# Patient Record
Sex: Male | Born: 1940
Health system: Southern US, Community
[De-identification: ages and names within clinical notes are randomized; demographics above are authoritative.]

## PROBLEM LIST (undated history)

## (undated) DIAGNOSIS — N529 Male erectile dysfunction, unspecified: Secondary | ICD-10-CM

## (undated) DIAGNOSIS — E1165 Type 2 diabetes mellitus with hyperglycemia: Secondary | ICD-10-CM

## (undated) DIAGNOSIS — K649 Unspecified hemorrhoids: Secondary | ICD-10-CM

## (undated) DIAGNOSIS — K5900 Constipation, unspecified: Secondary | ICD-10-CM

## (undated) DIAGNOSIS — J45909 Unspecified asthma, uncomplicated: Secondary | ICD-10-CM

## (undated) DIAGNOSIS — I2510 Atherosclerotic heart disease of native coronary artery without angina pectoris: Secondary | ICD-10-CM

## (undated) DIAGNOSIS — N400 Enlarged prostate without lower urinary tract symptoms: Secondary | ICD-10-CM

## (undated) DIAGNOSIS — K589 Irritable bowel syndrome without diarrhea: Secondary | ICD-10-CM

## (undated) DIAGNOSIS — E785 Hyperlipidemia, unspecified: Secondary | ICD-10-CM

## (undated) DIAGNOSIS — I10 Essential (primary) hypertension: Secondary | ICD-10-CM

## (undated) DIAGNOSIS — J309 Allergic rhinitis, unspecified: Secondary | ICD-10-CM

## (undated) HISTORY — DX: Unspecified asthma, uncomplicated: J45.909

## (undated) HISTORY — PX: CORONARY ANGIOPLASTY WITH STENT PLACEMENT: SHX49

## (undated) HISTORY — DX: Hyperlipidemia, unspecified: E78.5

## (undated) HISTORY — DX: Male erectile dysfunction, unspecified: N52.9

## (undated) HISTORY — DX: Benign prostatic hyperplasia without lower urinary tract symptoms: N40.0

## (undated) HISTORY — DX: Atherosclerotic heart disease of native coronary artery without angina pectoris: I25.10

## (undated) HISTORY — DX: Allergic rhinitis, unspecified: J30.9

## (undated) HISTORY — DX: Constipation, unspecified: K59.00

## (undated) HISTORY — PX: TONSILLECTOMY: SUR1361

## (undated) HISTORY — DX: Unspecified hemorrhoids: K64.9

## (undated) HISTORY — DX: Type 2 diabetes mellitus with hyperglycemia: E11.65

## (undated) HISTORY — DX: Irritable bowel syndrome, unspecified: K58.9

## (undated) HISTORY — DX: Essential (primary) hypertension: I10

---

## 2006-10-29 HISTORY — PX: CORONARY ARTERY BYPASS GRAFT: SHX141

## 2007-03-19 ENCOUNTER — Ambulatory Visit (HOSPITAL_BASED_OUTPATIENT_CLINIC_OR_DEPARTMENT_OTHER): Admission: RE | Admit: 2007-03-19 | Discharge: 2007-03-19 | Payer: Self-pay | Admitting: Pediatrics

## 2007-03-24 ENCOUNTER — Ambulatory Visit: Payer: Self-pay | Admitting: Internal Medicine

## 2007-03-31 ENCOUNTER — Inpatient Hospital Stay (HOSPITAL_BASED_OUTPATIENT_CLINIC_OR_DEPARTMENT_OTHER): Admission: RE | Admit: 2007-03-31 | Discharge: 2007-03-31 | Payer: Self-pay | Admitting: Interventional Cardiology

## 2007-03-31 ENCOUNTER — Ambulatory Visit: Payer: Self-pay | Admitting: Vascular Surgery

## 2007-03-31 ENCOUNTER — Ambulatory Visit (HOSPITAL_COMMUNITY): Admission: RE | Admit: 2007-03-31 | Discharge: 2007-03-31 | Payer: Self-pay | Admitting: Interventional Cardiology

## 2007-05-29 ENCOUNTER — Ambulatory Visit: Payer: Self-pay | Admitting: Cardiothoracic Surgery

## 2007-05-30 ENCOUNTER — Ambulatory Visit: Payer: Self-pay | Admitting: Cardiothoracic Surgery

## 2007-06-06 ENCOUNTER — Inpatient Hospital Stay (HOSPITAL_COMMUNITY): Admission: RE | Admit: 2007-06-06 | Discharge: 2007-06-11 | Payer: Self-pay | Admitting: Cardiothoracic Surgery

## 2007-06-06 ENCOUNTER — Ambulatory Visit: Payer: Self-pay | Admitting: Cardiothoracic Surgery

## 2007-06-25 ENCOUNTER — Encounter: Admission: RE | Admit: 2007-06-25 | Discharge: 2007-06-25 | Payer: Self-pay | Admitting: Interventional Cardiology

## 2007-07-04 ENCOUNTER — Ambulatory Visit: Payer: Self-pay | Admitting: Cardiothoracic Surgery

## 2007-07-04 ENCOUNTER — Encounter: Admission: RE | Admit: 2007-07-04 | Discharge: 2007-07-04 | Payer: Self-pay | Admitting: Cardiothoracic Surgery

## 2007-07-10 ENCOUNTER — Encounter (HOSPITAL_COMMUNITY): Admission: RE | Admit: 2007-07-10 | Discharge: 2007-10-08 | Payer: Self-pay | Admitting: Interventional Cardiology

## 2007-10-09 ENCOUNTER — Encounter (HOSPITAL_COMMUNITY): Admission: RE | Admit: 2007-10-09 | Discharge: 2007-10-29 | Payer: Self-pay | Admitting: Interventional Cardiology

## 2008-01-03 IMAGING — CR DG CHEST 1V PORT
1 series · 1 of 1 positions shown · non-contrast
Comparison: 06/07/07.

CLINICAL DATA: Coronary artery disease.
 PORTABLE CHEST ? 1 VIEW ? 06/08/07 ? 2022 HOURS:

[view not recorded]
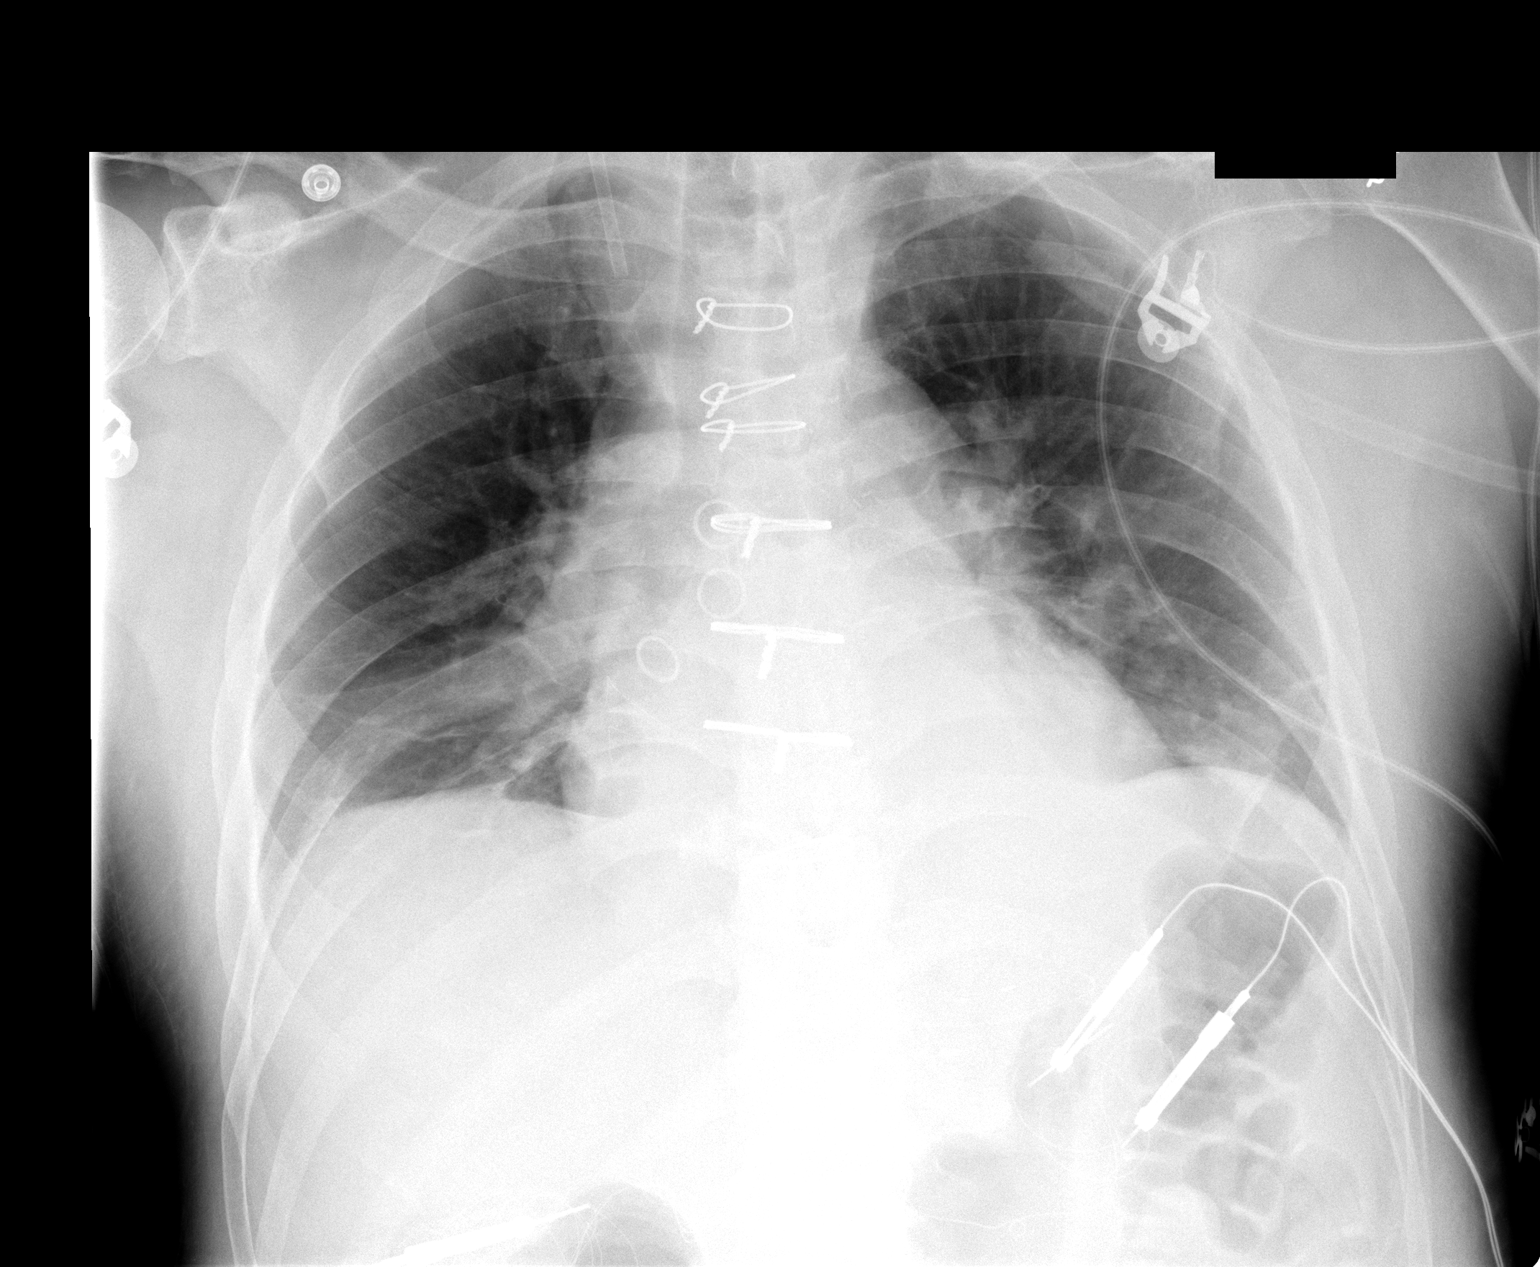

[1 of 1 positions shown; findings below may reference images not displayed]

FINDINGS: Left chest tubes have been removed.  No pneumothorax.  Swan-Ganz catheter is out, but the introducer left in place. Bibasilar atelectasis is worse.
IMPRESSION: 1. Chest tube removal without pneumothorax.  
 2. Worsening bibasilar atelectasis.

## 2008-01-29 IMAGING — CR DG CHEST 2V
2 series · 2 of 2 positions shown · non-contrast
Comparison: none

CLINICAL DATA: Post CABG, 06/06/07.
 CHEST - TWO VIEWS:

[w chest pa]
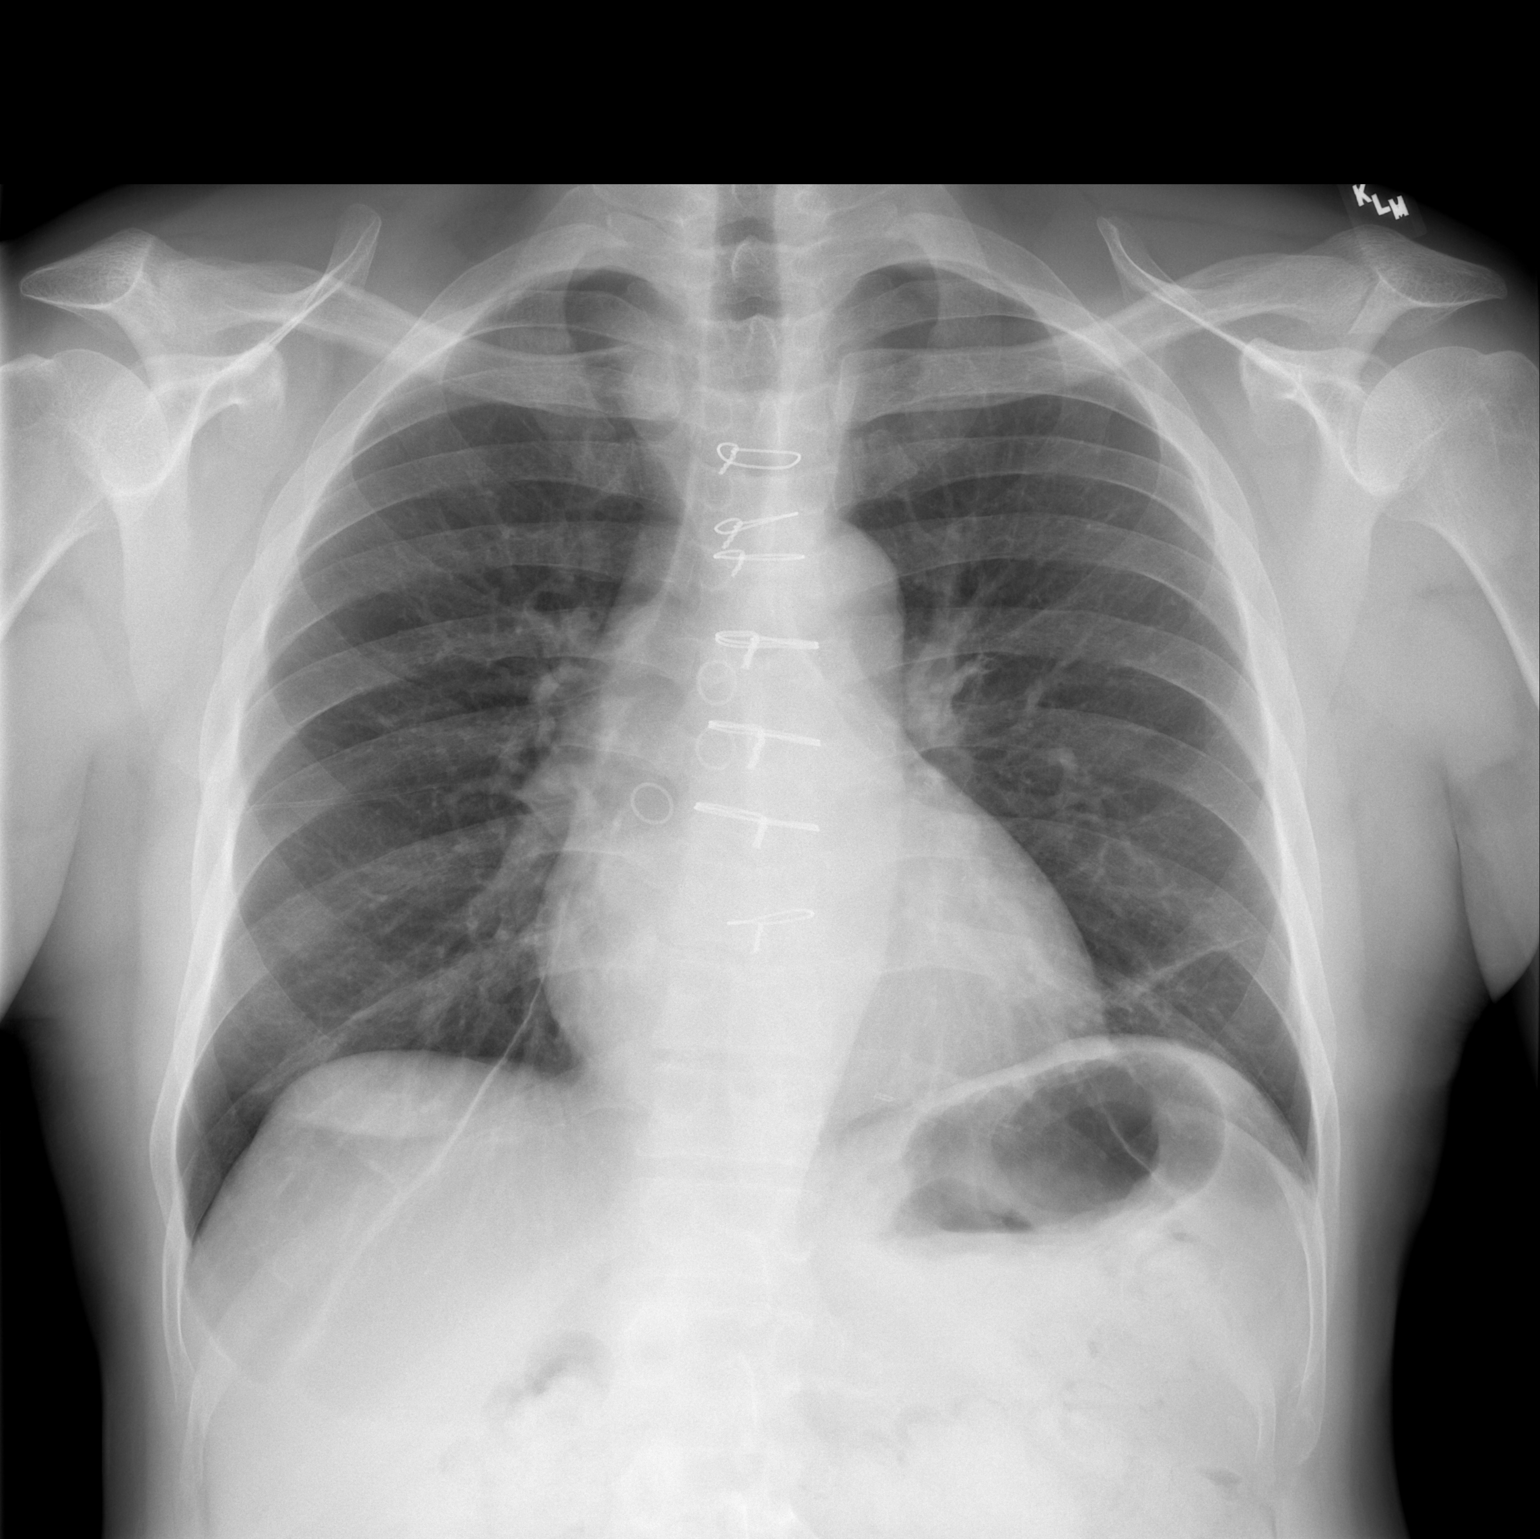

[w chest lat]
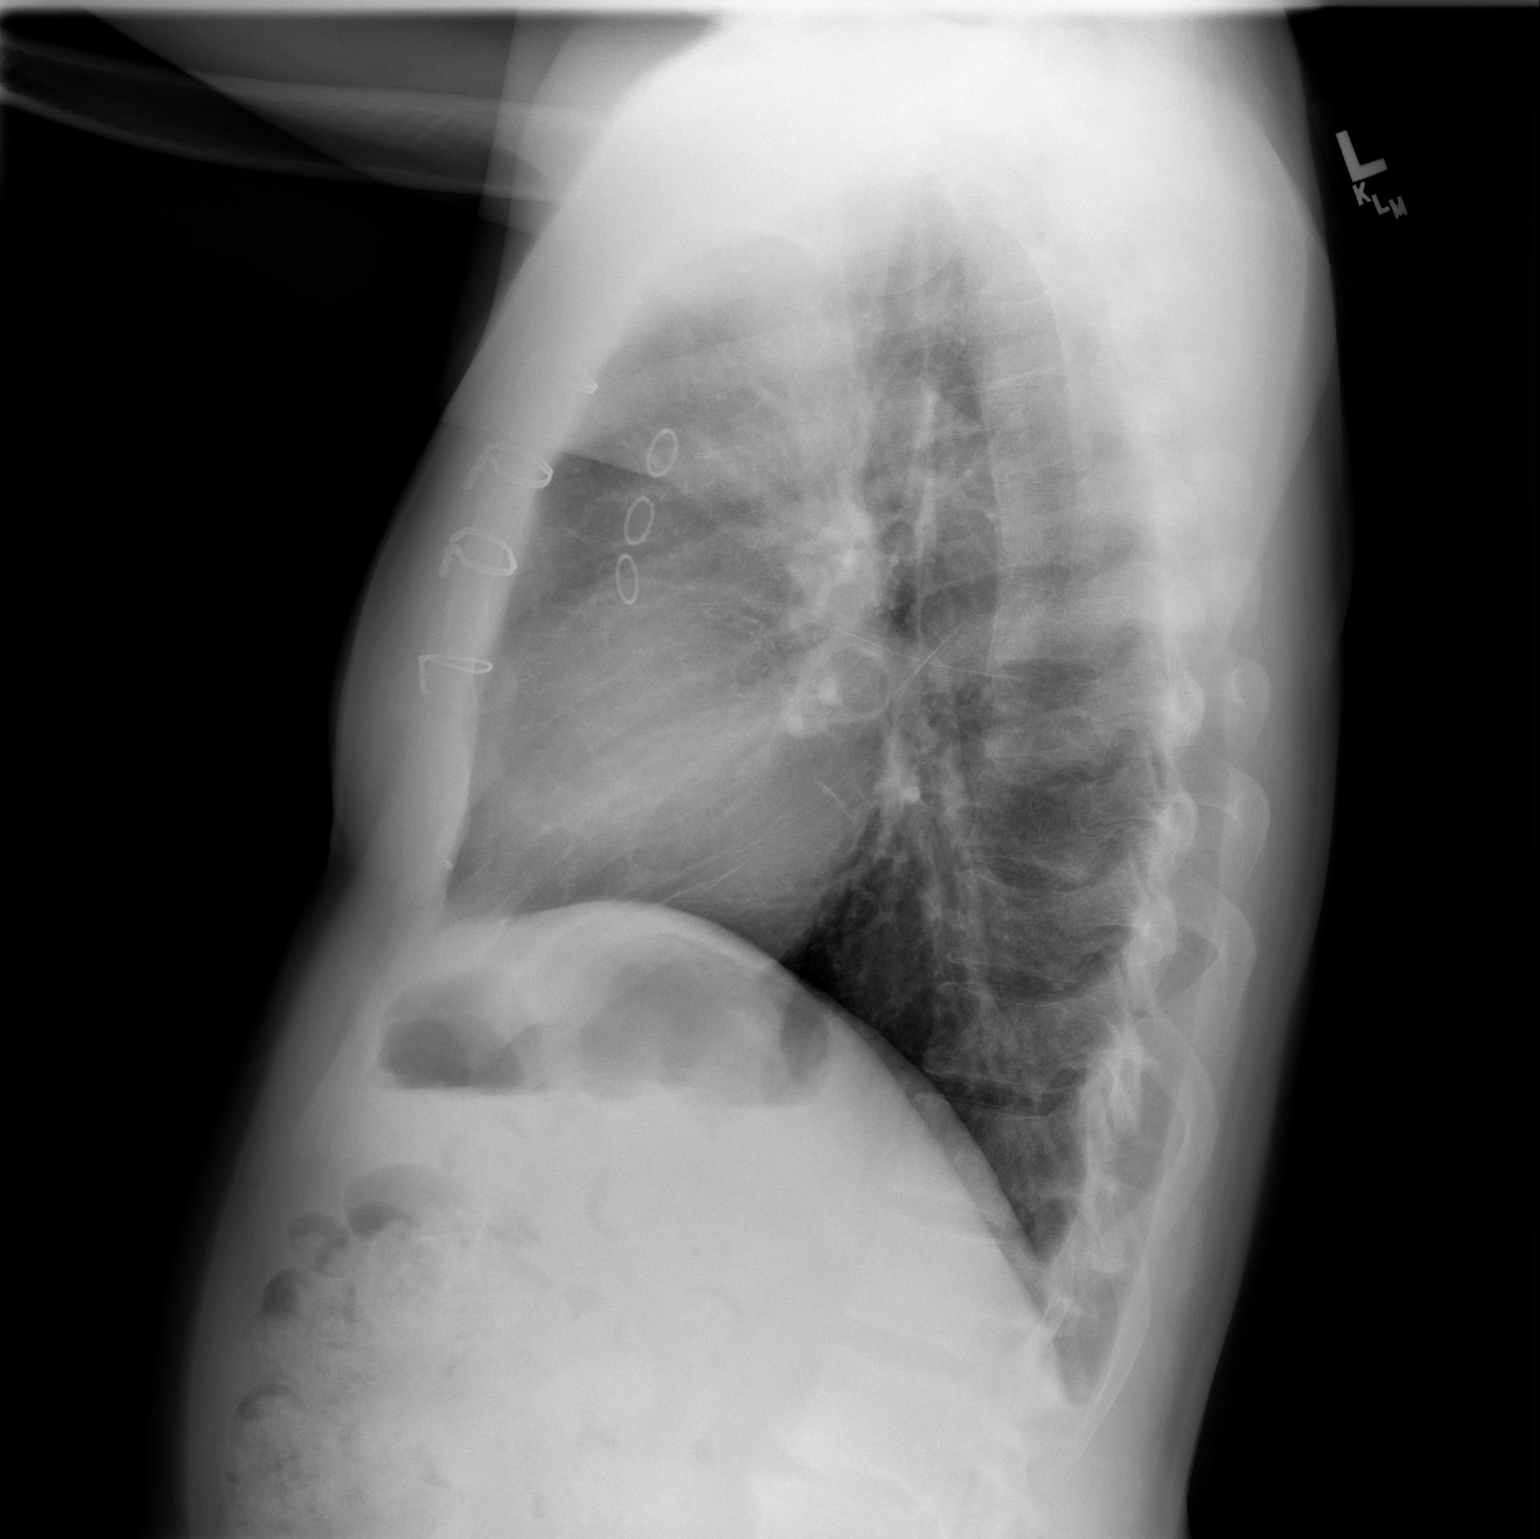

[2 of 2 positions shown; findings below may reference images not displayed]

FINDINGS: Clearing of previous very small pleural effusions noted with slight persistent linear bibasilar subsegmental atelectasis.  Lungs are otherwise clear.  Heart size is normal with post CABG changes.  Mediastinum, hila, pleura, and osseous structures appear normal.
IMPRESSION: 1.  Resolution of previous tiny bilateral pleural effusions.
 2.  Slight bibasilar linear atelectasis.
 3.  Post CABG.
 4.  Otherwise no active disease.

## 2008-03-09 ENCOUNTER — Encounter: Admission: RE | Admit: 2008-03-09 | Discharge: 2008-03-09 | Payer: Self-pay | Admitting: Interventional Radiology

## 2008-05-19 ENCOUNTER — Encounter: Admission: RE | Admit: 2008-05-19 | Discharge: 2008-05-19 | Payer: Self-pay | Admitting: Interventional Radiology

## 2008-05-26 ENCOUNTER — Encounter: Admission: RE | Admit: 2008-05-26 | Discharge: 2008-05-26 | Payer: Self-pay | Admitting: Interventional Radiology

## 2008-06-29 ENCOUNTER — Encounter: Admission: RE | Admit: 2008-06-29 | Discharge: 2008-06-29 | Payer: Self-pay | Admitting: Interventional Radiology

## 2008-11-16 ENCOUNTER — Encounter: Admission: RE | Admit: 2008-11-16 | Discharge: 2008-11-16 | Payer: Self-pay | Admitting: Interventional Radiology

## 2008-12-21 IMAGING — US US EXTREM LOW VENOUS*L*
1 series · 14 of 23 positions shown · non-contrast
Comparison: none

CLINICAL DATA: Symptomatic left leg varicose veins, now 1 week
status post transcatheter laser occlusion of the   greater
saphenous vein.

LEFT LOWER EXTREMITY VENOUS DOPPLER ULTRASOUND
TECHNIQUE: Gray-scale sonography with compression as well as color
and duplex Doppler ultrasound were performed to evaluate   the
saphenous vein and the deep venous system from the level of the
common femoral vein through the popliteal and proximal calf veins.

[Series 1: us extrem low venous*left* · 14 of 23 slices shown]
[im 1/23]
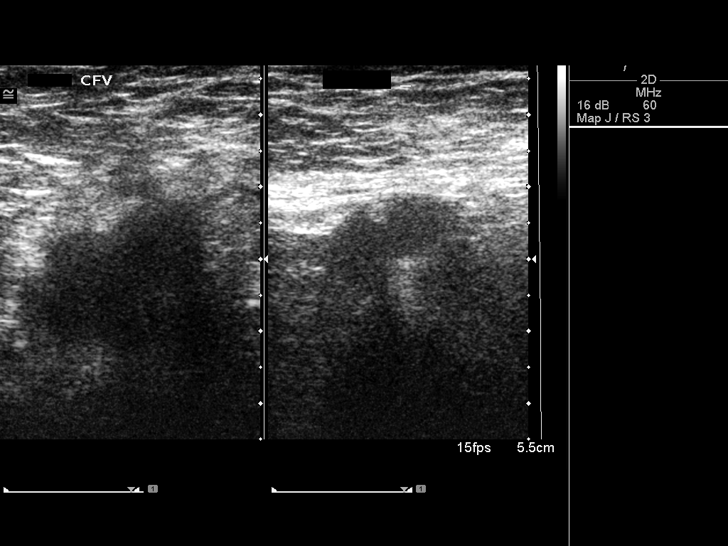
[im 3/23]
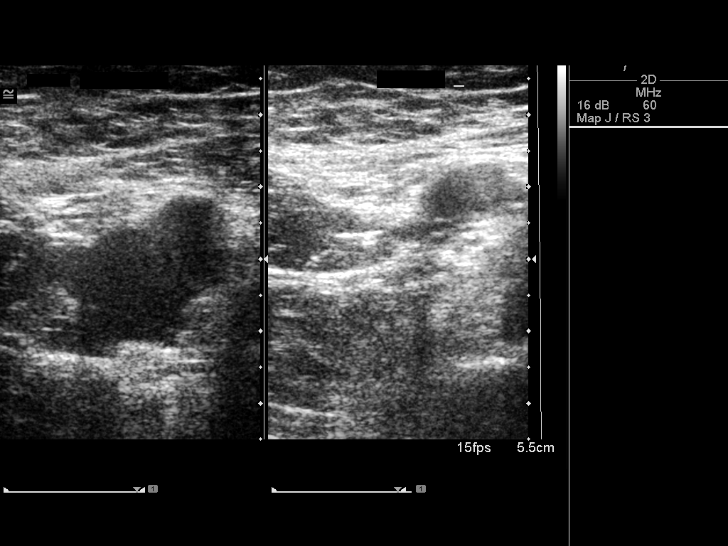
[im 5/23]
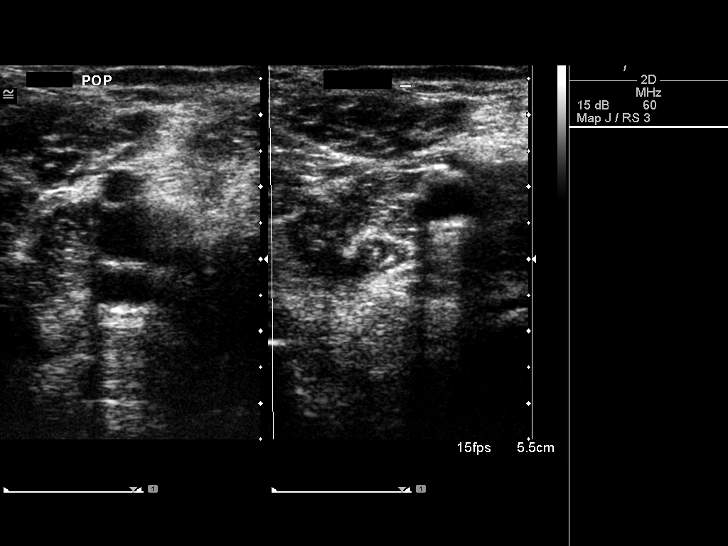
[im 6/23]
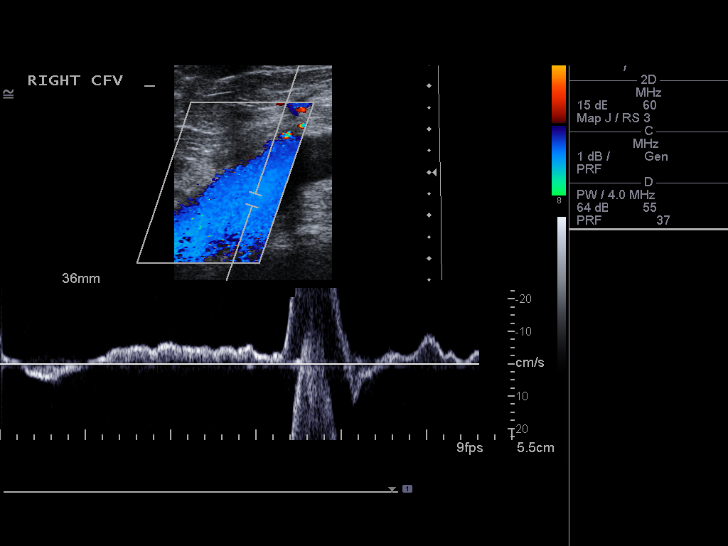
[im 8/23]
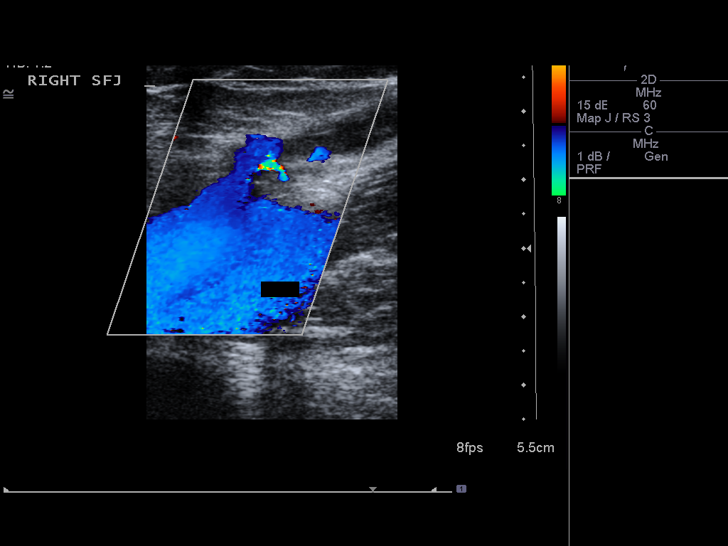
[im 10/23]
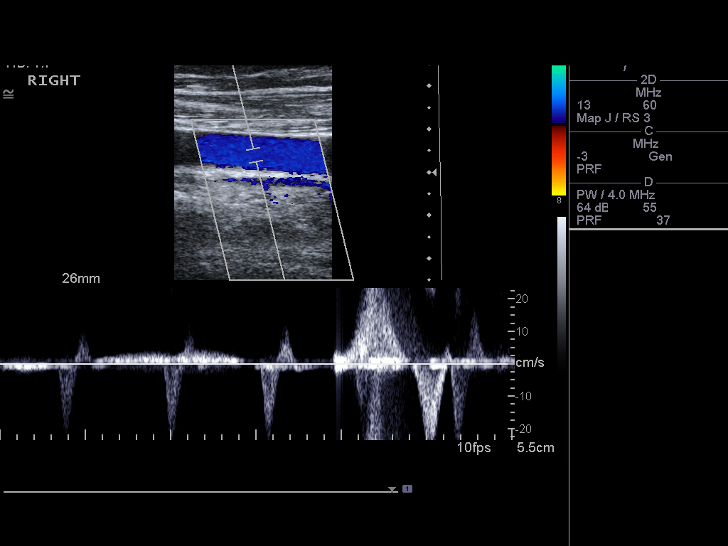
[im 11/23]
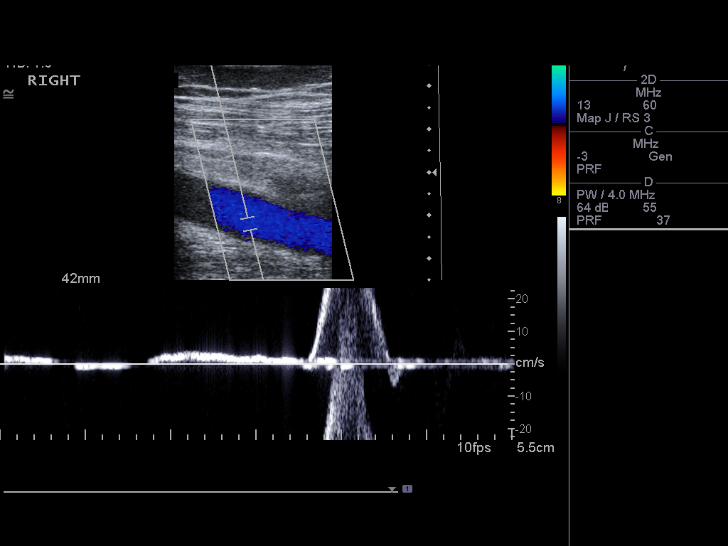
[im 13/23]
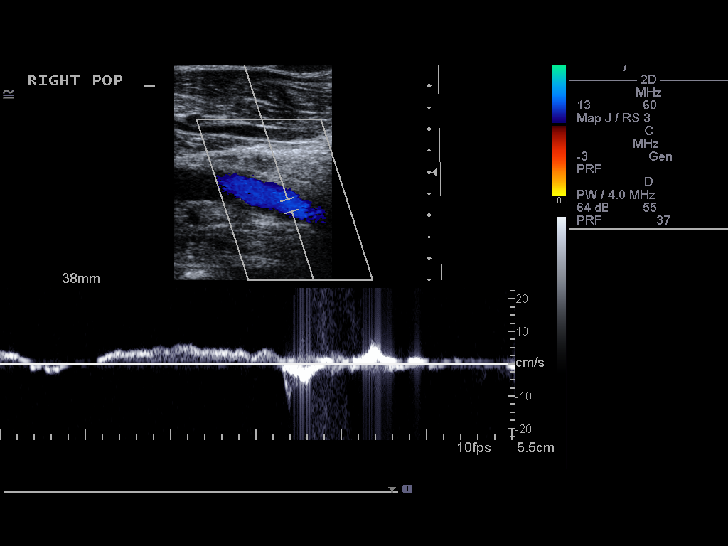
[im 14/23]
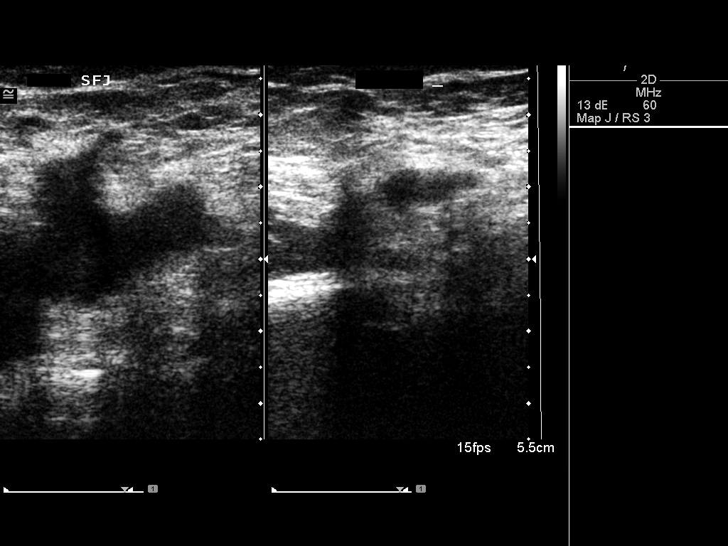
[im 16/23]
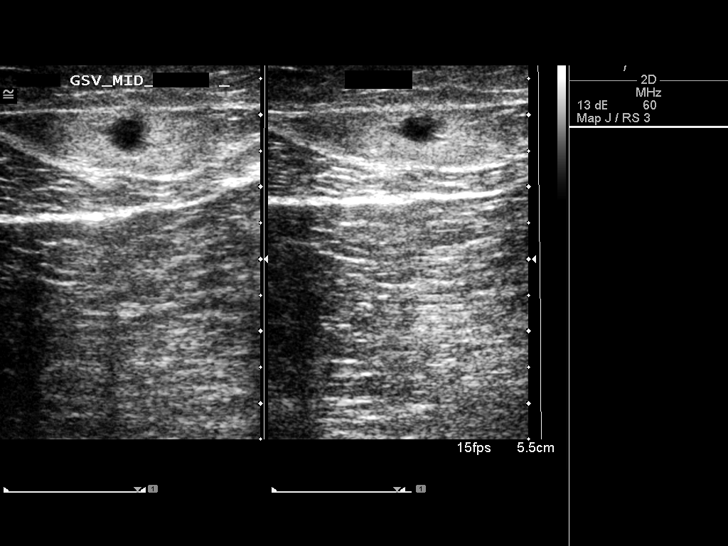
[im 18/23]
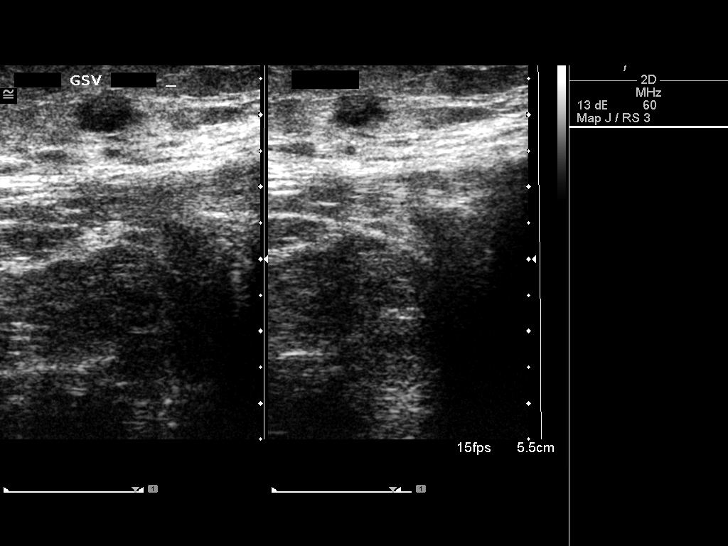
[im 19/23]
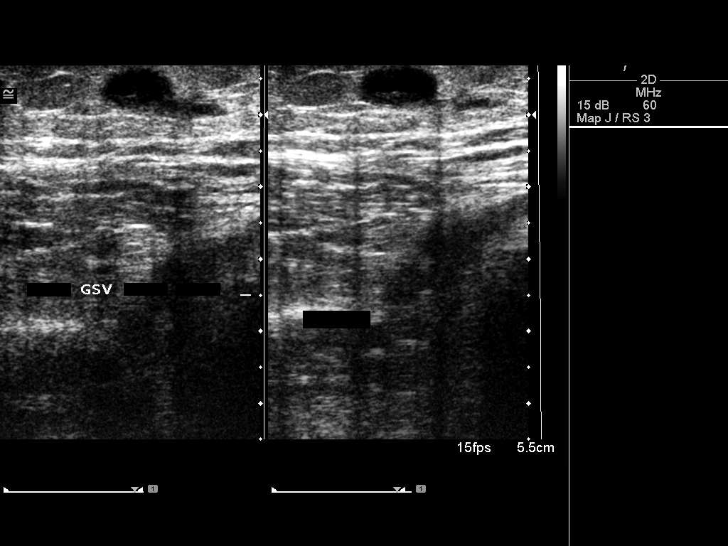
[im 21/23]
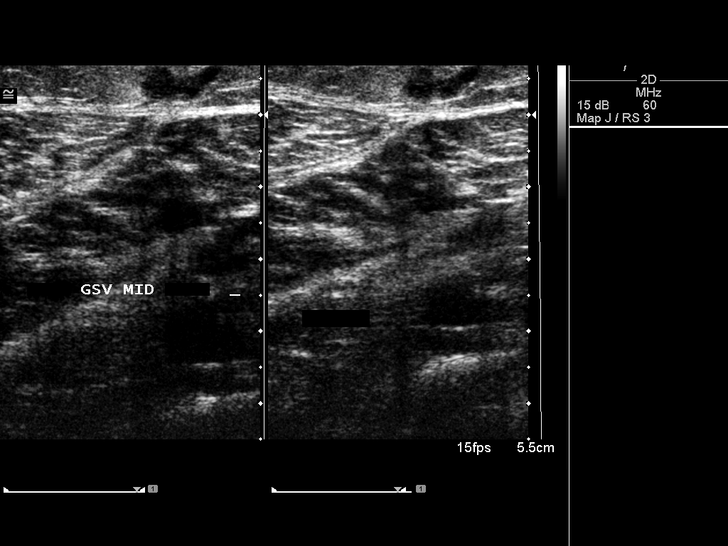
[im 23/23]
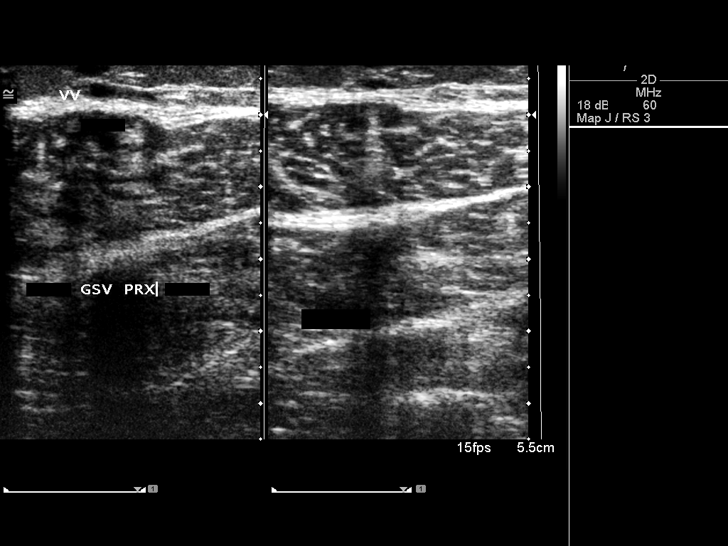

[14 of 23 positions shown; findings below may reference images not displayed]

FINDINGS: The lower extremity deep venous system demonstrates
normal compressibility, phasicity, and augmentation. Posterior
tibial vein unremarkable. No evidence of DVT.

The greater saphenous vein is occluded throughout the treatment
segment. Associated varicose veins in the thigh and calf region are
decompressed.

IMPRESSION
1. Technically successful occlusion of the greater saphenous vein
along the treatment length without apparent complication.
2. Normal deep venous system. No DVT.

## 2010-11-20 ENCOUNTER — Encounter: Payer: Self-pay | Admitting: Interventional Radiology

## 2011-03-13 NOTE — Op Note (Signed)
[name] [name], SOMMERS NO.:  [id number]   MEDICAL RECORD NO.:  [id number]          PATIENT TYPE:  INP   LOCATION:  2008                         FACILITY:  [facility]   PHYSICIAN:  Sheliah Plane, MD    DATE OF BIRTH:  12-18-1940   DATE OF PROCEDURE:  06/06/2007  DATE OF DISCHARGE:                               OPERATIVE REPORT   PREOP DIAGNOSIS:  Coronary occlusive disease.   POSTOP DIAGNOSIS:  Coronary occlusive disease.   SURGICAL PROCEDURE:  Coronary artery bypass grafting times six with the  left internal mammary to left anterior descending coronary artery,  reverse saphenous vein graft to the diagonal coronary artery, sequential  reverse saphenous vein graft to the first and second obtuse marginal,  sequential reverse saphenous vein graft to the posterior descending and  posterolateral branches of the right coronary artery with right leg  thigh and calf Endovein harvesting.   SURGEON:  Dr. Tyrone Sage.   FIRST ASSISTANT:  Berneta Levins, surgical assistant.   BRIEF HISTORY:  Patient is a 69 year old male who has had progressive  anginal symptoms, was evaluated by Dr. Verdis Prime.  Cardiac  catheterization was performed which demonstrated diffuse three-vessel  coronary artery disease.  Coronary artery bypass grafting was  recommended to the patient.  After discussion of his options and his own  investigation of surgical options, he agreed to proceed with coronary  artery bypass grafting.  At the time of catheterization he was found to  have preserved LV function, 50-60% left main, 70-80% proximal LAD  involving diagonal.  In addition, he had a proximal 80% circ and also  disease in both branches of fairly small obtuse marginals.  The right  coronary artery was diffusely diseased both proximally and distally.  There was a 90% lesion in the posterolateral branch.  The distal vessels  were relatively small with distal disease.   DESCRIPTION OF THE PROCEDURE:  With  Theone Murdoch and arterial line monitors  in place the patient underwent general endotracheal anesthesia without  incidence.  The skin of the chest and legs were prepped with Betadine,  draped in the usual sterile manner.  Using the [employer] Endovein  harvesting system, vein was harvested from the right thigh and right  cath.  A median sternotomy was performed.  Left internal mammary artery  was dissected down as a pedicle graft.  The distal artery was divided,  had good free flow.  The pericardium was opened and overall ventricular  function appeared preserved.  The patient was systemically heparinized.  Because of the diffuse nature of the patient's disease and the number of  grafts needed, it was felt that the safest approach was to proceed with  on-pump bypass.  Patient was systemically heparinized, ascending aorta  was cannulated, the right atrium was cannulated.  He was placed on  cardiopulmonary bypass 2.4L/min per m2.  Sites of anastomosis were  selected and dissected out of the epicardium.  Patient body temperature  was cooled to 30 degrees.  Aorta cross clamp was applied.  Cold blood  potassium cardioplegia, 500 mL, was administered with  rapid diastolic  arrest of the heart, myocardial septal temperature was monitored  throughout the cross clamp.  Attention was turned first to the obtuse  marginal vessels.  The first and second obtuse marginal were each  opened, they were relatively small and partially __________ using each  at a 1 mm probe.  They were at more proximally 1.4 mm in size.  Using a  diamond type side-to-side anatomosis with a running #8-0 Prolene, the  first obtuse marginal was bypassed.  The distal in the same vein was  then carried to the second obtuse marginal which was slightly smaller  but did admit a 1 mm probe.  Using a running #8-0 Prolene, distal  anastomosis was performed.  Attention was then turned to the diagonal  coronary artery which was opened and admitted  a 1.5 mm probe.  Using a  running #7-0 Prolene, distal anastomosis was performed.  Attention was  then turned to the right system.  Posterior descending was diffusely  diseased both proximally and distally.  An area suitable to open the  vessel was dissected from the epicardium and opened, 1 mm probe passed  distally, a 1.5 mm probe passed proximally.  Using diamond type, a side-  to-side anastomosis was carried out with a running #7-0 Prolene.  Distal  extent of the same vein was then carried, extended to the posterolateral  branch.  Using a running #7-0 Prolene, distal anastomosis was performed.  Additional cold blood cardioplegia was administered down the vein  grafts.  Attention was turned to the left anterior descending coronary  artery which was opened between the mid and distal third.  Using a  running #8-0 Prolene, the left internal mammary artery was anastomosed  into the left anterior descending coronary artery.  With release of the  Edwards bulldog on the mammary artery there was appropriate rise in  myocardial septal temperature.  A bulldog was placed back.  Cross clamp  still in place.  Three punch aortotomies were performed.  Each of the  three vein grafts were anastomosed into the ascending aorta.  Air was  evacuated from the grafts and aorta cross clamp was removed.  The  patient spontaneously converted to a sinus rhythm.  Sites of anastomosis  were inspected and free of bleeding.  Patient was then ventilated and  weaned from cardiopulmonary bypass without difficulty and remained  hemodynamically stable.  It was decannulated in the usual fashion.  Protamine sulfate was administered.  With the operative field  hemostatic, two atrial, two ventricular pacing wires were applied, graft  __________ was applied.  Left pleural tube and a Blake mediastinal drain  were left in place.  Pericardium was closed with a #6 stainless steel  wire, fascia closed with interrupted #0 Vicryl,  running #3-0 Vicryl on  subcutaneous tissue, #4-0 subcuticular stitch in the skin edges.  Dry  dressings were applied.  Sponge and needle count was reported as correct  at the completion of the procedure.  The patient tolerated the procedure  without obvious complication, was transferred to the surgical intensive  care unit for further postoperative care.      Sheliah Plane, MD  Electronically Signed     EG/MEDQ  D:  06/08/2007  T:  06/08/2007  Job:  [id number]   cc:   Lyn Records, M.D.

## 2011-03-13 NOTE — Assessment & Plan Note (Signed)
OFFICE VISIT   MAXUM, CASSARINO R  DOB:  1941/08/28                                        July 04, 2007  CHART #:  [id number]   CURRENT PROBLEMS:  1. Status post coronary artery bypass grafting x6, June 06, 2007, by      Dr. Ofilia Neas for 3-vessel disease, left main stenosis, and      progressive angina.  2. Hypertension.   PRESENT ILLNESS:  The patient returns for his first office visit after  undergoing coronary artery bypass grafting by Dr. Tyrone Sage in early  August.  He is doing well without recurrent angina or symptoms or  congestive heart failure.  The surgical incisions have healed  uneventfully.  He remains on his hospital discharge medications as  listed previously, including aspirin, Toprol XL 25 mg, Pravachol 40 mg a  day, and p.r.n. oxycodone.   PHYSICAL EXAMINATION:  Blood pressure 106/90, pulse 70, respirations 18,  saturation 98%.  He is alert and oriented and pleasant.  His breath  sounds are clear and equal.  The sternum is stable and well healed.  Cardiac rhythm is regular without S3 gallop or rub.  The leg incision is  well healed and there is no peripheral edema.  A PA and lateral chest x-  ray reveals clear lung fields without pleural effusion and a stable  cardiac silhouette.   IMPRESSION:  The patient has made an excellent early recovery and is  ready to resume driving and light level activities.  He is encouraged to  enter the outpatient rehab program at the hospital.  He will continue his current medications and avoid lifting more than 20  pounds until the 1st of November.  The patient will return here as  needed.   Kerin Perna, M.D.  Electronically Signed   PV/MEDQ  D:  07/04/2007  T:  07/05/2007  Job:  [id number]   cc:   Lyn Records, M.D.  Thora [name], M.D.

## 2011-03-13 NOTE — Cardiovascular Report (Signed)
NAMESELBY, FOISY NO.:  [id number]   MEDICAL RECORD NO.:  [id number]          PATIENT TYPE:  OIB   LOCATION:  1962                         FACILITY:  [facility]   PHYSICIAN:  Lyn Records, M.D.   DATE OF BIRTH:  03/31/1941   DATE OF PROCEDURE:  DATE OF DISCHARGE:                            CARDIAC CATHETERIZATION   INDICATIONS:  Exertional angina.   PROCEDURE PERFORMED:  1. Left heart cath.  2. Selective coronary angio.  3. Left ventriculography.   DESCRIPTION:  After informed consent, a 4-French sheath was placed in  the right femoral artery using the modified Seldinger technique.  A 4-  [place] A2 multi-purpose catheter was used for hemodynamic recordings,  left ventriculography by hand injection, and selective right coronary  angiography.  A #4-French left Judkins catheter was used for left  coronary angiography.  Case was terminated.  No complications occurred.   RESULTS:  1. Hemodynamic data:      a.     Aortic pressure 134/74.      b.     Left ventricular pressure was 134/12 volts.  2. Left ventriculography:  LV cavity size and systolic function are      normal.  EF is 65%.  3. Coronary angiography:  Heavy coronary calcification is noted in the      LAD, circumflex and right coronary territory.  There is a      particular heavy region of distal right coronary calcification, I      think, includes a distal right coronary stent.      a.     Left main coronary:  Left main coronary artery is moderately       to severely diseased with proximal eccentric 40-50% stenosis and       mid-to-distal left main, 40-50% stenosis.      b.     The left anterior descending coronary:  The left anterior       descending coronary artery is a large vessel that wraps around the       left ventricular apex.  There is distal 60-70% stenosis.  There is       proximal eccentric tandem 70-80% stenoses within a heavily       calcified segment.  The first diagonal is small and  diffusely       diseased.  The second diagonal is larger and contains a 70-80%       stenosis.      c.     Circumflex artery:  The circumflex coronary artery contains       70% the second obtuse marginal and 70-80% mid-circumflex reported       in third obtuse marginal.      d.     The right coronary:  The right coronary contains diffuse       luminal irregularities throughout the proximal mid.  The proximal       third of the PDA contains a 70% stenosis.  The distal RCA beyond       the distal stent contains an eccentric 95% stenosis before the  LV       branch.   CONCLUSION:  1. Significant multi-vessel coronary disease with involvement of the      left main, the proximal and mid-LAD, the      mid-circumflex and obtuse marginal in the distal right coronary.  2. Normal LV function.   PLAN:  Review cine angiograms.  Consider surgical revascularization.      Lyn Records, M.D.  Electronically Signed     HWS/MEDQ  D:  03/31/2007  T:  03/31/2007  Job:  [id number]   cc:   Georgann Housekeeper, MD  Thora [name], M.D.

## 2011-03-13 NOTE — Discharge Summary (Signed)
NAMEORENTHAL, DEBSKI NO.:  [id number]   MEDICAL RECORD NO.:  [id number]          PATIENT TYPE:  INP   LOCATION:  2008                         FACILITY:  [facility]   PHYSICIAN:  Sheliah Plane, MD    DATE OF BIRTH:  [birth year]   DATE OF ADMISSION:  06/06/2007  DATE OF DISCHARGE:  06/10/2007                               DISCHARGE SUMMARY   HISTORY OF PRESENT ILLNESS:  The patient is a 70 year old male who over  the past 6 months has noted increasing symptoms of shortness of breath  and vague chest discomfort.  These symptoms would come with exertion  related to exercise, but also he had had some episodes while doing some  landscaping work in his yard.  The symptoms would dissipate within 5  minutes of slowing down, but he would not need to stop completely.  He  has a history of coronary artery disease, having had previous  angioplasty of the LAD in [place] in 1989 and 1990.  He has no history of  myocardial infarction.  He does have a longstanding history of  hypertension treated since 1989.  He also has a history of  hyperlipidemia.  He also has a family history of coronary disease, with  his father deceased at age 58 from a myocardial infarction.  Due to  this, he was evaluated, including cardiac catheterization as an  outpatient by Dr. Katrinka Blazing, and he was found to have normal left  ventricular function and multivessel coronary artery disease.  Please  see the history and physical for further details of this study, as it is  outlined there.  Due to these severe anatomical findings, it was Dr.  Michaelle Copas recommendation to proceed with surgical revascularization, and  he was referred to Sheliah Plane, MD for surgical consultation.  Dr.  Tyrone Sage evaluated the patient and studies and agreed with  recommendations to proceed with surgery.  He was admitted this  hospitalization for the procedure.   PAST MEDICAL HISTORY:  As above.   PAST SURGICAL HISTORY:  1. Right wrist  surgery in 1968 secondary to trauma.  2. History of skin cancer removed from the back greater than 25 years      ago.   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   FAMILY HISTORY:  As noted above, remarkable for coronary disease in his  father, deceased at age 54 from myocardial infarction.   MEDICATIONS PRIOR TO ADMISSION:  1. Dilantin/HCT 160/25 1 daily.  2. Pravastatin 40 mg daily.  3. Allergy shots.  4. Aspirin 81 mg daily.  5. Klor-Con 750 mg once daily.  6. Propecia 0.25 mg once daily.   ALLERGIES:  NONE KNOWN THOUGH HE DOES HAVE A POSSIBLE REACTION  PREVIOUSLY TO SULFA BUT IS UNFAMILIAR WITH THE DETAILS.   Physical exam and review of systems, please see the history and physical  done at the time of admission.   HOSPITAL COURSE:  The patient was admitted electively and on June 06, 2007 underwent the following procedure:  Coronary artery bypass grafting  x6.  The  following grafts were placed:  The left internal mammary artery  to the left anterior descending coronary artery, reverse saphenous vein  graft to the diagonal coronary artery, a sequential reversed saphenous  vein graft to the first and second obtuse marginal, and a sequential  reversed saphenous vein graft to the posterior descending and  posterolateral branches of the right coronary artery with right leg,  thigh, and calf end of vein harvesting.  The procedure was performed by  Sheliah Plane, MD and tolerated well.  He was taken to the surgical  intensive care unit in stable condition.   Postoperatively, the patient has overall done well.  He was extubated  without difficulty.  All routine lines, monitors, drainage devices were  discontinued in the standard fashion.  He has remained hemodynamically  stable with no significant cardiac dysrhythmias or ectopy.  He has  required a gentle diuresis but is responding well to this.  He does have  a postoperative anemia which is mild and has  stabilized.  Most recent  hemoglobin/hematocrit dated June 09, 2007 were 9.9 and 29.2,  respectively.  Electrolytes, BUN, and creatinine are within normal  limits.  Incisions are all healing well without evidence of infection.  He is tolerating cardiac rehabilitation in a routine manner.  His  overall status is felt to be tentatively stable for discharge in the  morning of June 10, 2007 pending morning round reevaluation.   INSTRUCTIONS:  The patient received written instructions regarding  medications, activity, diet, wound care, and followup.  Followup will  include Dr. Donata Clay for Dr. Tyrone Sage on July 04, 2007 at 11:45.  Additionally, he will see Dr. Michaelle Copas nurse practitioner on June 25, 2007 and obtain a chest x-ray from [facility], [address] in the [facility] at 8:30 a.m.   DISCHARGE MEDICATIONS:  1. Coated aspirin 325 mg daily.  2. Toprol-XL 25 mg daily.  3. Pravachol 40 mg daily.  4. He is resume his Propecia and resume his allergy shots.  5. For pain, oxycodone 1 or 2 every 4-6 hours as needed.   FINAL DIAGNOSIS:  Severe multivessel coronary artery disease now status  post surgical revascularization, as described.   OTHER DIAGNOSES:  1. Postoperative anemia.  2. History of hyperlipidemia.  3. History of hypertension.  4. History of right wrist surgery secondary to trauma.  5. History of skin cancer removal from the back greater than 25 years      ago.      Rowe Clack, P.A.-C.      Sheliah Plane, MD  Electronically Signed    WEG/MEDQ  D:  06/09/2007  T:  06/10/2007  Job:  [id number]   cc:   Sheliah Plane, MD  Lyn Records, M.D.  Thora [name], M.D.

## 2011-03-13 NOTE — Consult Note (Signed)
NEW PATIENT CONSULTATION   [name], [name]  DOB:  29-May-1941                                        May 30, 2007  CHART #:  [id number]   FOLLOW-UP CARDIOLOGIST:  Lyn Records, M.D.   PRIMARY CARE PHYSICIAN:  Thora [name], M.D.   REASON FOR CONSULTATION:  Coronary occlusive disease.   HISTORY OF PRESENT ILLNESS:  The patient is a 70 year old male who over  the past 6 months had noted increasing shortness of breath and vague  misery in his chest, but only when he exercised and reached a heart  rate of 135.  He noted symptoms also while doing heavy digging in his  yard, redoing the landscaping.  Usually, the discomfort would dissipate  within 5 minutes of slowing down, but he would not need to stop  completely.  He does have a previous history of known coronary occlusive  disease.  The details are not known, but in 1989 and in 1990 he had  angioplasties of the LAD done in [place].  He has had no previous history  of myocardial infarction.  He does have a longstanding history of  hypertension since 1989, treated.  History of hyperlipidemia.  Denies  diabetes.  Not a smoker.  Does have positive family history.  Father  died at age 39 of a myocardial infarction.  Mother died at age 38 of  asthma.  He has no siblings.  He has no previous history of stroke, no  claudication, no renal insufficiency.   PREVIOUS SURGICAL HISTORY:  1. Surgery on the right wrist in 1968 secondary to trauma.  2. History of skin cancer removed from the back greater than 25 years      ago.   The patient is married.  Retired Administrator, Civil Service.   CURRENT MEDICATIONS:  1. Diovan/HCT 160/25 once a day.  2. Pravastatin 40 mg once a day.  3. Allergy shots.  4. Aspirin 81 mg a day.  5. Klor-Con 750 mg once a day.  6. Propecia 1 mg 1/4 tab once a day.   ALLERGIES:  None known, though he does wonder about SULFA sensitivity,  but does not remember what problems he had with it.   CARDIAC REVIEW  OF SYSTEMS:  Positive for chest pain with exertion of  heart rate of 135, and exertional shortness of breath with rapid heart  rate.  He denies resting shortness of breath, orthopnea, syncope,  presyncope, palpitations, or lower extremity edema.   GENERAL REVIEW OF SYSTEMS:  No change in weight.  Denies constitutional  symptoms.  Denies amaurosis.  Denies hemoptysis.  Does note nocturia x2  on a regular basis, with decreased stream when he gets up at night.  No  trouble urinating during the day.  Denies hematuria.  Denies neurologic  seizures, paresthesias.  Denies depression or anxiety.   PHYSICAL EXAMINATION:  Blood pressure is 144/91 in the right, 137/87 on  the left.  Pulse is 77, respiratory rate is 18.  O2 saturation 96%.  He  is 5 feet 7-1/2 inches tall, weighs 175 pounds.  The patient is awake,  alert, neurologically intact, and able to relate his history.  Pupils  are equal, round, and reactive to light.  Neck is without carotid  bruits.  Lungs are clear bilaterally.  Cardiac exam reveals regular rate  and  rhythm, without murmur or gallop.  Abdominal exam is benign, without  palpable masses or tenderness.  He has no lower extremity edema.  He  does have significant varicosities over the left calf below the knee.  There are none present on the right.  Both thighs do not have evidence  of varicosities.  He has 2+ DP and PT pulses.   Cardiac catheterization was performed as an outpatient by Dr. Katrinka Blazing on  03/31/2007.  The patient had normal LV function, and multivessel  coronary artery disease.  There were significant calcifications  throughout the proximal LAD and circumflex and right coronary arteries.  The left main is moderately diseased, with 40%-50% stenosis.  The LAD is  a long vessel and wraps around the apex.  There is a distal 60%-70% and  tandem eccentric proximal stenoses of 70%-80%.  The diagonal is small  and diffusely diseased.  Circumflex system has two obtuse  marginal  branches, each with approximately 70% stenosis, also fairly small, with  diffuse distal disease.  The right coronary artery has diffuse luminal  irregularities in the proximal PD.  This is 70%.  The distal right  beyond, there is a 95% stenosis.   IMPRESSION:  Patient with stable exertional symptoms, with significant 3-  vessel coronary artery disease, including 50% left main obstruction.   I agree with Dr. Katrinka Blazing to recommend coronary artery bypass grafting.  With the diffuse nature of his disease and highly calcified lesions,  angioplasty would not be a reasonable option.  I have recommended  coronary artery bypass grafting to him.  He was very interested in off-  pump bypass, which could be considered.  However, I told him my  impression that with both the number of vessels that he needed, their  relatively small size, especially the circumflex vessels, and the  diffuse nature of the disease, that standard on-pump bypass would  probably be preferred.  The risks of surgery, including death,  infection, stroke, myocardial infarction, bleeding, and blood  transfusion were all discussed with the patient in detail.  He is at  this point unsure exactly what decision he would like to make, and is  deferred to call me back in the next several days after he discusses it  with his wife.  His questions have been answered.    Addendum: After discussing situation with his wife, [name] called back  today 06/02/07 and wished to proceed with CABG plan for Aug 8,2008   Sheliah Plane, MD  Electronically Signed   EG/MEDQ  D:  05/30/2007  T:  05/31/2007  Job:  [id number]   cc:   Lyn Records, M.D.  Thora [name], M.D.

## 2011-03-13 NOTE — Procedures (Signed)
NAME:  [name] [name], [name] [name]                 ACCOUNT NO.:  [id number]   MEDICAL RECORD NO.:  [id number]          PATIENT TYPE:  OUT   LOCATION:  SLEEP CENTER                 FACILITY[facility]  [facility]   PHYSICIAN:  Clinton D. Maple Hudson, MD, FCCP, FACPDATE OF BIRTH:  12-09-42   DATE OF STUDY:  03/19/2007                            NOCTURNAL POLYSOMNOGRAM   REFERRING PHYSICIAN:  Stephannie Li, M.D.   INDICATION FOR STUDY:  Hypersomnia with sleep apnea.   EPWORTH SLEEPINESS SCORE:  6/24.   BMI 27.4, weight 175 pounds.   MEDICATIONS:  Home medications are listed and reviewed.   SLEEP ARCHITECTURE:  Total sleep time 345 minutes with sleep deficiency  86%.  Stage I was 3%, stage II 69%, stages III and IV 3%, REM 25% of  total sleep time.  Sleep latency 21 minutes.  REM latency 65 minutes.  Awake after sleep onset 38 minutes.  Arousal index 10.  Pravachol was  taken at bedtime.   RESPIRATORY DATA:  Split study protocol.  Apnea/hypopnea index (AHI,  RDI) 13.4 obstructive events per hour indicating mild obstructive sleep  apnea/hypopnea syndrome before CPAP.  This included one obstructive  apnea and 32 hypopneas before CPAP.  Events were positional with all  sleep and events recorded while supine.  CPAP was titrated to 9 CWP, AHI  0/hour.  A medium Mirage Quattro mask was used with heated humidifier.   OXYGEN DATA:  Moderate snoring with oxygen desaturation to a nadir of  82% before CPAP.  After CPAP, control saturation held 95% on room air.   CARDIAC DATA:  Normal sinus rhythm.   MOVEMENT-PARASOMNIA:  No movement disturbance of sleep, bathroom x1.   IMPRESSIONS-RECOMMENDATIONS:  1. Mild obstructive sleep apnea/hypopnea syndrome, AHI 13.4/hour with      all events and sleep recorded by supine.  Moderately loud snoring      with oxygen desaturation transiently to a nadir of 82%.  2. Successful CPAP titration to 9 CWP, AHI 0/hour.  A medium Mirage      Quattro mask was used with heated  humidifier.      Clinton D. Maple Hudson, MD, [facility], FACP  Diplomate, Biomedical engineer of Sleep Medicine  Electronically Signed     CDY/MEDQ  D:  03/24/2007 09:18:20  T:  03/24/2007 10:02:19  Job:  [id number]

## 2011-03-13 NOTE — Discharge Summary (Signed)
NAMEIBN, STIEF NO.:  [id number]   MEDICAL RECORD NO.:  [id number]          PATIENT TYPE:  INP   LOCATION:  2008                         FACILITY:  [facility]   PHYSICIAN:  Sheliah Plane, MD    DATE OF BIRTH:  1941-01-16   DATE OF ADMISSION:  06/06/2007  DATE OF DISCHARGE:  06/11/2007                               DISCHARGE SUMMARY   ADDENDUM:  The patient was tentatively felt to be stable for discharge on or about  June 10, 2007.  He did require a couple of additional days for further  recovery.  He continued to work with cardiac rehabilitation.  He did  develop a low-grade fever on June 10, 2007.  Urinalysis was  unremarkable.  It is noted that he did have a small amount of hematuria  at the time of his Foley catheterization, but this has resolved.  He  defervesced over the next day and had no other symptoms relating to  possible infection.  On evaluation on the morning of June 11, 2007, he  was felt to be quite stable for discharge at that time.   MEDICATIONS AT DISCHARGE:  As previously dictated.   DISCHARGE INSTRUCTIONS:  As previously dictated.   FINAL DIAGNOSES:  Are as previously dictated.   There are no other changes associated with this dictation.      Rowe Clack, P.A.-C.      Sheliah Plane, MD  Electronically Signed    WEG/MEDQ  D:  08/12/2007  T:  08/12/2007  Job:  [id number]   cc:   Gerlene Burdock A. Alanda Amass, M.D.  Thora [name], M.D.

## 2011-08-13 LAB — POCT I-STAT 4, (NA,K, GLUC, HGB,HCT)
Glucose, Bld: 101 — ABNORMAL HIGH
Glucose, Bld: 102 — ABNORMAL HIGH
Glucose, Bld: 110 — ABNORMAL HIGH
Glucose, Bld: 118 — ABNORMAL HIGH
Glucose, Bld: 95
HCT: 26 — ABNORMAL LOW
HCT: 26 — ABNORMAL LOW
HCT: 27 — ABNORMAL LOW
HCT: 39
HCT: 42
Hemoglobin: 13.3
Hemoglobin: 14.3
Hemoglobin: 8.8 — ABNORMAL LOW
Hemoglobin: 8.8 — ABNORMAL LOW
Hemoglobin: 9.2 — ABNORMAL LOW
Operator id: 3406
Operator id: 3406
Operator id: 3406
Operator id: 3406
Operator id: 3406
Potassium: 3.5
Potassium: 3.8
Potassium: 3.8
Potassium: 3.9
Potassium: 5.8 — ABNORMAL HIGH
Sodium: 134 — ABNORMAL LOW
Sodium: 134 — ABNORMAL LOW
Sodium: 137
Sodium: 137
Sodium: 137

## 2011-08-13 LAB — POCT I-STAT 3, ART BLOOD GAS (G3+)
Acid-base deficit: 1
Acid-base deficit: 1
Bicarbonate: 23.7
Bicarbonate: 23.7
Bicarbonate: 25.6 — ABNORMAL HIGH
O2 Saturation: 100
O2 Saturation: 99
O2 Saturation: 99
Operator id: 279171
Operator id: 279171
Operator id: 3406
Patient temperature: 35.5
Patient temperature: 38.2
TCO2: 25
TCO2: 25
TCO2: 27
pCO2 arterial: 34.4 — ABNORMAL LOW
pCO2 arterial: 42.8
pCO2 arterial: 48.1 — ABNORMAL HIGH
pH, Arterial: 7.335 — ABNORMAL LOW
pH, Arterial: 7.358
pH, Arterial: 7.441
pO2, Arterial: 126 — ABNORMAL HIGH
pO2, Arterial: 131 — ABNORMAL HIGH
pO2, Arterial: 280 — ABNORMAL HIGH

## 2011-08-13 LAB — COMPREHENSIVE METABOLIC PANEL
ALT: 55 — ABNORMAL HIGH
AST: 44 — ABNORMAL HIGH
Albumin: 4.2
Alkaline Phosphatase: 37 — ABNORMAL LOW
BUN: 13
CO2: 25
Calcium: 9.3
Chloride: 107
Creatinine, Ser: 0.77
GFR calc Af Amer: >60
GFR calc non Af Amer: >60
Glucose, Bld: 92
Potassium: 3.4 — ABNORMAL LOW
Sodium: 138
Total Bilirubin: 1.2
Total Protein: 7.3

## 2011-08-13 LAB — I-STAT EC8
Acid-base deficit: 3 — ABNORMAL HIGH
BUN: 9
Bicarbonate: 22.1
Chloride: 104
Glucose, Bld: 90
HCT: 32 — ABNORMAL LOW
Hemoglobin: 10.9 — ABNORMAL LOW
Operator id: 279171
Potassium: 3.5
Sodium: 141
TCO2: 23
pCO2 arterial: 36.8
pH, Arterial: 7.387

## 2011-08-13 LAB — CBC
HCT: 30.5 — ABNORMAL LOW
HCT: 32 — ABNORMAL LOW
HCT: 32.4 — ABNORMAL LOW
HCT: 33.7 — ABNORMAL LOW
HCT: 45
Hemoglobin: 10.4 — ABNORMAL LOW
Hemoglobin: 10.9 — ABNORMAL LOW
Hemoglobin: 11 — ABNORMAL LOW
Hemoglobin: 11.5 — ABNORMAL LOW
Hemoglobin: 15.6
MCHC: 33.9
MCHC: 34
MCHC: 34.1
MCHC: 34.1
MCHC: 34.5
MCV: 88.2
MCV: 89.9
MCV: 90.2
MCV: 90.3
MCV: 90.5
MCV: 90.7
MCV: 90.8
Platelets: 117 — ABNORMAL LOW
Platelets: 120 — ABNORMAL LOW
Platelets: 127 — ABNORMAL LOW
Platelets: 127 — ABNORMAL LOW
Platelets: 132 — ABNORMAL LOW
Platelets: 218
RBC: 3.21 — ABNORMAL LOW
RBC: 3.4 — ABNORMAL LOW
RBC: 3.53 — ABNORMAL LOW
RBC: 3.59 — ABNORMAL LOW
RBC: 3.73 — ABNORMAL LOW
RBC: 5.1
RDW: 12.6
RDW: 12.9
RDW: 13.1
RDW: 13.2
RDW: 13.4
RDW: 13.4
WBC: 10.7 — ABNORMAL HIGH
WBC: 11.7 — ABNORMAL HIGH
WBC: 12.2 — ABNORMAL HIGH
WBC: 13.9 — ABNORMAL HIGH
WBC: 14.4 — ABNORMAL HIGH
WBC: 15.8 — ABNORMAL HIGH
WBC: 8.6

## 2011-08-13 LAB — I-STAT 8, (EC8 V) (CONVERTED LAB)
BUN: 12
Bicarbonate: 25.6 — ABNORMAL HIGH
Chloride: 100
Glucose, Bld: 150 — ABNORMAL HIGH
HCT: 29 — ABNORMAL LOW
Hemoglobin: 9.9 — ABNORMAL LOW
Operator id: 279171
Potassium: 3.7
Sodium: 133 — ABNORMAL LOW
TCO2: 27
pCO2, Ven: 47.8
pH, Ven: 7.337 — ABNORMAL HIGH

## 2011-08-13 LAB — BASIC METABOLIC PANEL
BUN: 11
BUN: 8
CO2: 30
Calcium: 7.6 — ABNORMAL LOW
Chloride: 101
Chloride: 108
Chloride: 99
Creatinine, Ser: 0.81
Creatinine, Ser: 0.85
Creatinine, Ser: 0.95
GFR calc Af Amer: >60
GFR calc Af Amer: >60
GFR calc non Af Amer: >60
GFR calc non Af Amer: >60
GFR calc non Af Amer: >60
Glucose, Bld: 125 — ABNORMAL HIGH
Potassium: 3.6
Sodium: 140

## 2011-08-13 LAB — URINALYSIS, ROUTINE W REFLEX MICROSCOPIC
Bilirubin Urine: NEGATIVE
Bilirubin Urine: NEGATIVE
Glucose, UA: NEGATIVE
Glucose, UA: NEGATIVE
Hgb urine dipstick: NEGATIVE
Hgb urine dipstick: NEGATIVE
Ketones, ur: 15 — AB
Ketones, ur: NEGATIVE
Nitrite: NEGATIVE
Nitrite: NEGATIVE
Protein, ur: NEGATIVE
Protein, ur: NEGATIVE
Specific Gravity, Urine: 1.018
Specific Gravity, Urine: 1.02
Urobilinogen, UA: 0.2
Urobilinogen, UA: 0.2
pH: 5.5
pH: 8

## 2011-08-13 LAB — BLOOD GAS, ARTERIAL
Acid-Base Excess: 1.2
Bicarbonate: 25.1 — ABNORMAL HIGH
Drawn by: 274481
FIO2: 0.21
O2 Saturation: 96.9
Patient temperature: 98.6
TCO2: 26.4
pCO2 arterial: 39.3
pH, Arterial: 7.422
pO2, Arterial: 88.3

## 2011-08-13 LAB — CREATININE, SERUM
Creatinine, Ser: 0.8
Creatinine, Ser: 0.82
GFR calc Af Amer: >60
GFR calc Af Amer: >60
GFR calc non Af Amer: >60
GFR calc non Af Amer: >60

## 2011-08-13 LAB — URINE CULTURE
Colony Count: NO GROWTH
Culture: NO GROWTH
Special Requests: POSITIVE

## 2011-08-13 LAB — MAGNESIUM
Magnesium: 2.3
Magnesium: 2.4
Magnesium: 3 — ABNORMAL HIGH

## 2011-08-13 LAB — ABO/RH: ABO/RH(D): B POS

## 2011-08-13 LAB — APTT
aPTT: 30
aPTT: 41 — ABNORMAL HIGH

## 2011-08-13 LAB — HEMOGLOBIN AND HEMATOCRIT, BLOOD
HCT: 26.5 — ABNORMAL LOW
Hemoglobin: 9.3 — ABNORMAL LOW

## 2011-08-13 LAB — POCT I-STAT GLUCOSE
Glucose, Bld: 94
Operator id: 3406

## 2011-08-13 LAB — HEMOGLOBIN A1C
Hgb A1c MFr Bld: 6.3 — ABNORMAL HIGH
Mean Plasma Glucose: 147

## 2011-08-13 LAB — PROTIME-INR
INR: 1
INR: 1.6 — ABNORMAL HIGH
Prothrombin Time: 13.3
Prothrombin Time: 19.6 — ABNORMAL HIGH

## 2011-08-13 LAB — PLATELET COUNT: Platelets: 116 — ABNORMAL LOW

## 2011-08-13 LAB — TYPE AND SCREEN
ABO/RH(D): B POS
Antibody Screen: NEGATIVE

## 2011-11-02 DIAGNOSIS — J309 Allergic rhinitis, unspecified: Secondary | ICD-10-CM | POA: Diagnosis not present

## 2011-11-09 DIAGNOSIS — J309 Allergic rhinitis, unspecified: Secondary | ICD-10-CM | POA: Diagnosis not present

## 2011-11-22 DIAGNOSIS — Z0000 Encounter for general adult medical examination without abnormal findings: Secondary | ICD-10-CM | POA: Diagnosis not present

## 2011-11-22 DIAGNOSIS — Z125 Encounter for screening for malignant neoplasm of prostate: Secondary | ICD-10-CM | POA: Diagnosis not present

## 2011-11-22 DIAGNOSIS — I10 Essential (primary) hypertension: Secondary | ICD-10-CM | POA: Diagnosis not present

## 2011-12-03 DIAGNOSIS — J309 Allergic rhinitis, unspecified: Secondary | ICD-10-CM | POA: Diagnosis not present

## 2011-12-28 DIAGNOSIS — J309 Allergic rhinitis, unspecified: Secondary | ICD-10-CM | POA: Diagnosis not present

## 2012-01-01 DIAGNOSIS — H2510 Age-related nuclear cataract, unspecified eye: Secondary | ICD-10-CM | POA: Diagnosis not present

## 2012-01-30 DIAGNOSIS — J309 Allergic rhinitis, unspecified: Secondary | ICD-10-CM | POA: Diagnosis not present

## 2012-02-26 DIAGNOSIS — J309 Allergic rhinitis, unspecified: Secondary | ICD-10-CM | POA: Diagnosis not present

## 2012-03-04 DIAGNOSIS — K3189 Other diseases of stomach and duodenum: Secondary | ICD-10-CM | POA: Diagnosis not present

## 2012-03-04 DIAGNOSIS — R1013 Epigastric pain: Secondary | ICD-10-CM | POA: Diagnosis not present

## 2012-03-05 DIAGNOSIS — J45909 Unspecified asthma, uncomplicated: Secondary | ICD-10-CM | POA: Diagnosis not present

## 2012-03-05 DIAGNOSIS — J309 Allergic rhinitis, unspecified: Secondary | ICD-10-CM | POA: Diagnosis not present

## 2012-03-12 DIAGNOSIS — J309 Allergic rhinitis, unspecified: Secondary | ICD-10-CM | POA: Diagnosis not present

## 2012-03-20 DIAGNOSIS — J309 Allergic rhinitis, unspecified: Secondary | ICD-10-CM | POA: Diagnosis not present

## 2012-03-26 DIAGNOSIS — K3189 Other diseases of stomach and duodenum: Secondary | ICD-10-CM | POA: Diagnosis not present

## 2012-03-26 DIAGNOSIS — R1013 Epigastric pain: Secondary | ICD-10-CM | POA: Diagnosis not present

## 2012-04-04 DIAGNOSIS — J309 Allergic rhinitis, unspecified: Secondary | ICD-10-CM | POA: Diagnosis not present

## 2012-04-11 DIAGNOSIS — J309 Allergic rhinitis, unspecified: Secondary | ICD-10-CM | POA: Diagnosis not present

## 2012-04-21 DIAGNOSIS — E785 Hyperlipidemia, unspecified: Secondary | ICD-10-CM | POA: Diagnosis not present

## 2012-04-21 DIAGNOSIS — I10 Essential (primary) hypertension: Secondary | ICD-10-CM | POA: Diagnosis not present

## 2012-04-21 DIAGNOSIS — I2510 Atherosclerotic heart disease of native coronary artery without angina pectoris: Secondary | ICD-10-CM | POA: Diagnosis not present

## 2012-05-14 DIAGNOSIS — J309 Allergic rhinitis, unspecified: Secondary | ICD-10-CM | POA: Diagnosis not present

## 2012-06-04 DIAGNOSIS — J309 Allergic rhinitis, unspecified: Secondary | ICD-10-CM | POA: Diagnosis not present

## 2012-06-10 DIAGNOSIS — J309 Allergic rhinitis, unspecified: Secondary | ICD-10-CM | POA: Diagnosis not present

## 2012-07-07 DIAGNOSIS — J309 Allergic rhinitis, unspecified: Secondary | ICD-10-CM | POA: Diagnosis not present

## 2012-07-29 DIAGNOSIS — L0390 Cellulitis, unspecified: Secondary | ICD-10-CM | POA: Diagnosis not present

## 2012-07-29 DIAGNOSIS — S60569A Insect bite (nonvenomous) of unspecified hand, initial encounter: Secondary | ICD-10-CM | POA: Diagnosis not present

## 2012-07-29 DIAGNOSIS — W57XXXA Bitten or stung by nonvenomous insect and other nonvenomous arthropods, initial encounter: Secondary | ICD-10-CM | POA: Diagnosis not present

## 2012-07-29 DIAGNOSIS — L0291 Cutaneous abscess, unspecified: Secondary | ICD-10-CM | POA: Diagnosis not present

## 2012-07-31 DIAGNOSIS — S60569A Insect bite (nonvenomous) of unspecified hand, initial encounter: Secondary | ICD-10-CM | POA: Diagnosis not present

## 2012-07-31 DIAGNOSIS — L0390 Cellulitis, unspecified: Secondary | ICD-10-CM | POA: Diagnosis not present

## 2012-07-31 DIAGNOSIS — W57XXXA Bitten or stung by nonvenomous insect and other nonvenomous arthropods, initial encounter: Secondary | ICD-10-CM | POA: Diagnosis not present

## 2012-08-07 DIAGNOSIS — J309 Allergic rhinitis, unspecified: Secondary | ICD-10-CM | POA: Diagnosis not present

## 2012-09-10 DIAGNOSIS — E785 Hyperlipidemia, unspecified: Secondary | ICD-10-CM | POA: Diagnosis not present

## 2012-09-10 DIAGNOSIS — J309 Allergic rhinitis, unspecified: Secondary | ICD-10-CM | POA: Diagnosis not present

## 2012-09-22 DIAGNOSIS — J309 Allergic rhinitis, unspecified: Secondary | ICD-10-CM | POA: Diagnosis not present

## 2012-09-30 DIAGNOSIS — J309 Allergic rhinitis, unspecified: Secondary | ICD-10-CM | POA: Diagnosis not present

## 2012-10-09 DIAGNOSIS — J309 Allergic rhinitis, unspecified: Secondary | ICD-10-CM | POA: Diagnosis not present

## 2012-10-10 DIAGNOSIS — H43819 Vitreous degeneration, unspecified eye: Secondary | ICD-10-CM | POA: Diagnosis not present

## 2012-10-10 DIAGNOSIS — H35439 Paving stone degeneration of retina, unspecified eye: Secondary | ICD-10-CM | POA: Diagnosis not present

## 2012-10-10 DIAGNOSIS — H33199 Other retinoschisis and retinal cysts, unspecified eye: Secondary | ICD-10-CM | POA: Diagnosis not present

## 2012-10-10 DIAGNOSIS — H35419 Lattice degeneration of retina, unspecified eye: Secondary | ICD-10-CM | POA: Diagnosis not present

## 2012-10-15 DIAGNOSIS — J309 Allergic rhinitis, unspecified: Secondary | ICD-10-CM | POA: Diagnosis not present

## 2012-11-11 DIAGNOSIS — J309 Allergic rhinitis, unspecified: Secondary | ICD-10-CM | POA: Diagnosis not present

## 2012-11-29 DIAGNOSIS — IMO0002 Reserved for concepts with insufficient information to code with codable children: Secondary | ICD-10-CM

## 2012-11-29 DIAGNOSIS — E1165 Type 2 diabetes mellitus with hyperglycemia: Secondary | ICD-10-CM

## 2012-11-29 HISTORY — DX: Reserved for concepts with insufficient information to code with codable children: IMO0002

## 2012-11-29 HISTORY — DX: Type 2 diabetes mellitus with hyperglycemia: E11.65

## 2012-12-04 DIAGNOSIS — Z0000 Encounter for general adult medical examination without abnormal findings: Secondary | ICD-10-CM | POA: Diagnosis not present

## 2012-12-04 DIAGNOSIS — E785 Hyperlipidemia, unspecified: Secondary | ICD-10-CM | POA: Diagnosis not present

## 2012-12-04 DIAGNOSIS — I10 Essential (primary) hypertension: Secondary | ICD-10-CM | POA: Diagnosis not present

## 2012-12-04 DIAGNOSIS — Z1331 Encounter for screening for depression: Secondary | ICD-10-CM | POA: Diagnosis not present

## 2012-12-04 DIAGNOSIS — N400 Enlarged prostate without lower urinary tract symptoms: Secondary | ICD-10-CM | POA: Diagnosis not present

## 2012-12-04 DIAGNOSIS — R7301 Impaired fasting glucose: Secondary | ICD-10-CM | POA: Diagnosis not present

## 2012-12-05 DIAGNOSIS — J309 Allergic rhinitis, unspecified: Secondary | ICD-10-CM | POA: Diagnosis not present

## 2012-12-09 DIAGNOSIS — D235 Other benign neoplasm of skin of trunk: Secondary | ICD-10-CM | POA: Diagnosis not present

## 2012-12-09 DIAGNOSIS — L219 Seborrheic dermatitis, unspecified: Secondary | ICD-10-CM | POA: Diagnosis not present

## 2012-12-09 DIAGNOSIS — L820 Inflamed seborrheic keratosis: Secondary | ICD-10-CM | POA: Diagnosis not present

## 2012-12-30 DIAGNOSIS — E119 Type 2 diabetes mellitus without complications: Secondary | ICD-10-CM | POA: Diagnosis not present

## 2013-01-09 DIAGNOSIS — J309 Allergic rhinitis, unspecified: Secondary | ICD-10-CM | POA: Diagnosis not present

## 2013-02-05 DIAGNOSIS — J309 Allergic rhinitis, unspecified: Secondary | ICD-10-CM | POA: Diagnosis not present

## 2013-02-18 DIAGNOSIS — Z1211 Encounter for screening for malignant neoplasm of colon: Secondary | ICD-10-CM | POA: Diagnosis not present

## 2013-03-03 DIAGNOSIS — J309 Allergic rhinitis, unspecified: Secondary | ICD-10-CM | POA: Diagnosis not present

## 2013-03-30 DIAGNOSIS — J309 Allergic rhinitis, unspecified: Secondary | ICD-10-CM | POA: Diagnosis not present

## 2013-04-07 DIAGNOSIS — J309 Allergic rhinitis, unspecified: Secondary | ICD-10-CM | POA: Diagnosis not present

## 2013-04-16 DIAGNOSIS — J309 Allergic rhinitis, unspecified: Secondary | ICD-10-CM | POA: Diagnosis not present

## 2013-04-16 DIAGNOSIS — E119 Type 2 diabetes mellitus without complications: Secondary | ICD-10-CM | POA: Diagnosis not present

## 2013-04-16 DIAGNOSIS — I10 Essential (primary) hypertension: Secondary | ICD-10-CM | POA: Diagnosis not present

## 2013-04-20 DIAGNOSIS — J309 Allergic rhinitis, unspecified: Secondary | ICD-10-CM | POA: Diagnosis not present

## 2013-04-23 DIAGNOSIS — I2510 Atherosclerotic heart disease of native coronary artery without angina pectoris: Secondary | ICD-10-CM | POA: Diagnosis not present

## 2013-04-23 DIAGNOSIS — I10 Essential (primary) hypertension: Secondary | ICD-10-CM | POA: Diagnosis not present

## 2013-04-23 DIAGNOSIS — E785 Hyperlipidemia, unspecified: Secondary | ICD-10-CM | POA: Diagnosis not present

## 2013-04-23 DIAGNOSIS — N529 Male erectile dysfunction, unspecified: Secondary | ICD-10-CM | POA: Diagnosis not present

## 2013-04-27 DIAGNOSIS — J309 Allergic rhinitis, unspecified: Secondary | ICD-10-CM | POA: Diagnosis not present

## 2013-05-21 DIAGNOSIS — J309 Allergic rhinitis, unspecified: Secondary | ICD-10-CM | POA: Diagnosis not present

## 2013-06-02 DIAGNOSIS — H40019 Open angle with borderline findings, low risk, unspecified eye: Secondary | ICD-10-CM | POA: Diagnosis not present

## 2013-06-02 DIAGNOSIS — H2510 Age-related nuclear cataract, unspecified eye: Secondary | ICD-10-CM | POA: Diagnosis not present

## 2013-06-12 DIAGNOSIS — J309 Allergic rhinitis, unspecified: Secondary | ICD-10-CM | POA: Diagnosis not present

## 2013-07-07 DIAGNOSIS — I2510 Atherosclerotic heart disease of native coronary artery without angina pectoris: Secondary | ICD-10-CM | POA: Diagnosis not present

## 2013-07-10 DIAGNOSIS — J309 Allergic rhinitis, unspecified: Secondary | ICD-10-CM | POA: Diagnosis not present

## 2013-07-17 DIAGNOSIS — J309 Allergic rhinitis, unspecified: Secondary | ICD-10-CM | POA: Diagnosis not present

## 2013-07-31 DIAGNOSIS — Z23 Encounter for immunization: Secondary | ICD-10-CM | POA: Diagnosis not present

## 2013-08-06 DIAGNOSIS — J309 Allergic rhinitis, unspecified: Secondary | ICD-10-CM | POA: Diagnosis not present

## 2013-08-13 DIAGNOSIS — I10 Essential (primary) hypertension: Secondary | ICD-10-CM | POA: Diagnosis not present

## 2013-08-13 DIAGNOSIS — E119 Type 2 diabetes mellitus without complications: Secondary | ICD-10-CM | POA: Diagnosis not present

## 2013-08-17 ENCOUNTER — Other Ambulatory Visit: Payer: Self-pay | Admitting: *Deleted

## 2013-08-17 DIAGNOSIS — E785 Hyperlipidemia, unspecified: Secondary | ICD-10-CM

## 2013-09-03 DIAGNOSIS — J309 Allergic rhinitis, unspecified: Secondary | ICD-10-CM | POA: Diagnosis not present

## 2013-09-10 ENCOUNTER — Other Ambulatory Visit: Payer: Self-pay

## 2013-09-11 ENCOUNTER — Other Ambulatory Visit (INDEPENDENT_AMBULATORY_CARE_PROVIDER_SITE_OTHER): Payer: Medicare Other

## 2013-09-11 DIAGNOSIS — E785 Hyperlipidemia, unspecified: Secondary | ICD-10-CM

## 2013-09-11 LAB — LIPID PANEL
LDL Cholesterol: 76 mg/dL (ref 0–99)
Total CHOL/HDL Ratio: 3

## 2013-09-11 LAB — HEPATIC FUNCTION PANEL
AST: 29 U/L (ref 0–37)
Alkaline Phosphatase: 33 U/L — ABNORMAL LOW (ref 39–117)
Bilirubin, Direct: 0.1 mg/dL (ref 0.0–0.3)
Total Bilirubin: 1.2 mg/dL (ref 0.3–1.2)

## 2013-09-14 ENCOUNTER — Telehealth: Payer: Self-pay

## 2013-09-14 NOTE — Telephone Encounter (Signed)
Message copied by Jarvis Newcomer on Mon Sep 14, 2013 12:20 PM ------      Message from: Verdis Prime      Created: Mon Sep 14, 2013  8:36 AM       At t arget, please repeat in one year. ------

## 2013-09-14 NOTE — Telephone Encounter (Signed)
lmom labs ok.At t arget, please repeat in one year.

## 2013-09-18 NOTE — Telephone Encounter (Signed)
Message copied by Jarvis Newcomer on Fri Sep 18, 2013  4:58 PM ------      Message from: Verdis Prime      Created: Fri Sep 18, 2013  1:42 PM       At target. Repeat 1 year ------

## 2013-10-02 DIAGNOSIS — J309 Allergic rhinitis, unspecified: Secondary | ICD-10-CM | POA: Diagnosis not present

## 2013-10-06 ENCOUNTER — Encounter: Payer: Medicare Other | Attending: Internal Medicine | Admitting: *Deleted

## 2013-10-06 ENCOUNTER — Encounter: Payer: Self-pay | Admitting: *Deleted

## 2013-10-06 ENCOUNTER — Telehealth: Payer: Self-pay | Admitting: Interventional Cardiology

## 2013-10-06 VITALS — Ht 67.0 in | Wt 168.4 lb

## 2013-10-06 DIAGNOSIS — Z713 Dietary counseling and surveillance: Secondary | ICD-10-CM | POA: Diagnosis not present

## 2013-10-06 DIAGNOSIS — E119 Type 2 diabetes mellitus without complications: Secondary | ICD-10-CM

## 2013-10-06 NOTE — Patient Instructions (Signed)
Plan:  Aim for 4 Carb Choices per meal (60 grams) +/- 1 either way  Aim for 0-2 Carbs per snack if hungry  Consider reading food labels for Total Carbohydrate of foods Continue with your current activity level as tolerated Continue checking BG at alternate times per day as directed by MD

## 2013-10-06 NOTE — Progress Notes (Signed)
Appt start time: 1045 end time:  1215.  Assessment:  Patient was seen on  10/06/13 for individual diabetes education. He was diagnosed just over a year ago and received diabetes education at [place]. Lives with wife, they both grocery shop and prepare meals. 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 SMBG 2-3 times a week.  Current HbA1c: 6.4%   Preferred Learning Style:   No preference indicated   Learning Readiness:   Ready  Change in progress  MEDICATIONS: see list, no diabetes medications at this time.  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  Discussed diabetes disease process and treatment options.  Discussed physiology of diabetes and role of obesity on insulin resistance.  Encouraged weight maintenance to manage glucose levels.  Discussed role of medications and diet in glucose control  Provided education on macronutrients on glucose levels.  Provided education on carb counting, importance of regularly scheduled meals/snacks, and meal planning  Discussed effects of physical activity on glucose levels and long-term glucose control.  Encouraged him to continue his current physical activity/week.  Discussed role of common diabetes medication on blood  glucose and possible side effects  Discussed blood glucose monitoring and interpretation.  Discussed recommended target ranges and individual ranges.    Described short-term complications: hyper- and hypo-glycemia.  Discussed causes,symptoms, and treatment options.  Discussed prevention, detection, and treatment of long-term complications.  Discussed the role of prolonged elevated glucose levels on body systems.  Provided information on:  Role of stress on blood glucose levels and discussed strategies to manage psychosocial issues.  Recommendations for long-term diabetes self-care.  Established checklist for medical, dental, and emotional self-care.  Plan:  Aim for 4 Carb Choices per meal (60 grams) +/- 1 either way  Aim for 0-2 Carbs per snack if hungry  Consider reading food labels for Total Carbohydrate of foods Continue with your current activity level as tolerated Continue checking BG at alternate times per day as directed by MD   Teaching Method Utilized: Visual, Auditory and Hands on  Handouts given during visit include: Living Well with Diabetes Carb Counting and Food Label handouts Meal Plan Card  Barriers to learning/adherence to lifestyle change: none  Diabetes self-care support plan:   [facility] support group available  Demonstrated degree of understanding via:  Teach Back   Monitoring/Evaluation:  Dietary intake, exercise, reading food labels, and body weight in 3 month(s).

## 2013-10-06 NOTE — Telephone Encounter (Signed)
New Message  Pt called---wishes to use MY chart to receive his results// please call back to help set up My chart

## 2013-10-06 NOTE — Telephone Encounter (Signed)
returned pt call will mail pt .mychart activation code. pt request copy of recent lipid, will mail copy of labs as well. pt verbalized understanding.

## 2013-11-09 DIAGNOSIS — J309 Allergic rhinitis, unspecified: Secondary | ICD-10-CM | POA: Diagnosis not present

## 2013-11-20 DIAGNOSIS — J309 Allergic rhinitis, unspecified: Secondary | ICD-10-CM | POA: Diagnosis not present

## 2013-11-25 DIAGNOSIS — J309 Allergic rhinitis, unspecified: Secondary | ICD-10-CM | POA: Diagnosis not present

## 2013-12-04 DIAGNOSIS — J309 Allergic rhinitis, unspecified: Secondary | ICD-10-CM | POA: Diagnosis not present

## 2013-12-10 DIAGNOSIS — J309 Allergic rhinitis, unspecified: Secondary | ICD-10-CM | POA: Diagnosis not present

## 2013-12-16 DIAGNOSIS — Z23 Encounter for immunization: Secondary | ICD-10-CM | POA: Diagnosis not present

## 2013-12-16 DIAGNOSIS — Z1331 Encounter for screening for depression: Secondary | ICD-10-CM | POA: Diagnosis not present

## 2013-12-16 DIAGNOSIS — Z125 Encounter for screening for malignant neoplasm of prostate: Secondary | ICD-10-CM | POA: Diagnosis not present

## 2013-12-16 DIAGNOSIS — E785 Hyperlipidemia, unspecified: Secondary | ICD-10-CM | POA: Diagnosis not present

## 2013-12-16 DIAGNOSIS — E119 Type 2 diabetes mellitus without complications: Secondary | ICD-10-CM | POA: Diagnosis not present

## 2013-12-16 DIAGNOSIS — I10 Essential (primary) hypertension: Secondary | ICD-10-CM | POA: Diagnosis not present

## 2013-12-16 DIAGNOSIS — Z0000 Encounter for general adult medical examination without abnormal findings: Secondary | ICD-10-CM | POA: Diagnosis not present

## 2014-01-05 DIAGNOSIS — J309 Allergic rhinitis, unspecified: Secondary | ICD-10-CM | POA: Diagnosis not present

## 2014-01-25 ENCOUNTER — Other Ambulatory Visit: Payer: Self-pay

## 2014-01-27 ENCOUNTER — Other Ambulatory Visit: Payer: Self-pay

## 2014-01-27 MED ORDER — PRAVASTATIN SODIUM 40 MG PO TABS: 40.0000 mg | ORAL_TABLET | Freq: Every day | ORAL | Status: DC

## 2014-02-02 DIAGNOSIS — J309 Allergic rhinitis, unspecified: Secondary | ICD-10-CM | POA: Diagnosis not present

## 2014-02-03 ENCOUNTER — Encounter: Payer: Medicare Other | Attending: Internal Medicine | Admitting: *Deleted

## 2014-02-03 VITALS — Ht 67.0 in | Wt 168.1 lb

## 2014-02-03 DIAGNOSIS — Z713 Dietary counseling and surveillance: Secondary | ICD-10-CM | POA: Diagnosis not present

## 2014-02-03 DIAGNOSIS — E119 Type 2 diabetes mellitus without complications: Secondary | ICD-10-CM

## 2014-02-03 NOTE — Progress Notes (Signed)
Appt start time: 0900 end time:  0930.  Assessment:  Patient was seen on  02/03/14 for individual diabetes education follow up. He continues to do well, reported SMBG continue to be within target ranges, his meter indicates 30 day average of 119 mg/dl! He states his last A1c on 12/16/13 was 6.5%.  Current HbA1c: 6.5%   Preferred Learning Style:   No preference indicated   Learning Readiness:   Ready  Change in progress  MEDICATIONS: see list, no diabetes medications at this time.  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  Doing great! ommended him on his continued success with his eating habits and other diabetes management behaviors.  Plan:  Continue to aim for 4 Carb Choices per meal (60 grams) +/- 1 either way  Continue to aim for 0-2 Carbs per snack if hungry  Continue reading food labels for Total Carbohydrate of foods Continue with your current activity level as tolerated Continue checking BG at alternate times per day as directed by MD   Teaching Method Utilized: Visual, Auditory and Hands on  Handouts given during visit include: None today  Barriers to learning/adherence to lifestyle change: none  Diabetes self-care support plan:   [facility] support group available  Demonstrated degree of understanding via:  Teach Back   Monitoring/Evaluation:   Dietary intake, exercise, reading food labels, and body weight PRN

## 2014-02-03 NOTE — Patient Instructions (Signed)
Plan:  Continue to aim for 4 Carb Choices per meal (60 grams) +/- 1 either way  Continue to aim for 0-2 Carbs per snack if hungry  Continue reading food labels for Total Carbohydrate of foods Continue with your current activity level as tolerated Continue checking BG at alternate times per day as directed by MD

## 2014-02-05 ENCOUNTER — Encounter: Payer: Self-pay | Admitting: *Deleted

## 2014-02-16 DIAGNOSIS — E119 Type 2 diabetes mellitus without complications: Secondary | ICD-10-CM | POA: Diagnosis not present

## 2014-02-16 DIAGNOSIS — H2510 Age-related nuclear cataract, unspecified eye: Secondary | ICD-10-CM | POA: Diagnosis not present

## 2014-02-26 DIAGNOSIS — J309 Allergic rhinitis, unspecified: Secondary | ICD-10-CM | POA: Diagnosis not present

## 2014-03-03 ENCOUNTER — Encounter: Payer: Self-pay | Admitting: Interventional Cardiology

## 2014-03-24 DIAGNOSIS — J309 Allergic rhinitis, unspecified: Secondary | ICD-10-CM | POA: Diagnosis not present

## 2014-04-02 DIAGNOSIS — J309 Allergic rhinitis, unspecified: Secondary | ICD-10-CM | POA: Diagnosis not present

## 2014-04-16 ENCOUNTER — Other Ambulatory Visit: Payer: Self-pay

## 2014-04-16 ENCOUNTER — Encounter: Payer: Self-pay | Admitting: Interventional Cardiology

## 2014-04-16 MED ORDER — VALSARTAN 320 MG PO TABS: 320.0000 mg | ORAL_TABLET | Freq: Every day | ORAL | Status: DC

## 2014-04-16 NOTE — Telephone Encounter (Signed)
Refill rqst received from pt for valsartan 320mg  via Mychart. Rx sent to [facility]

## 2014-04-20 ENCOUNTER — Telehealth: Payer: Self-pay

## 2014-04-20 DIAGNOSIS — J309 Allergic rhinitis, unspecified: Secondary | ICD-10-CM | POA: Diagnosis not present

## 2014-04-20 MED ORDER — METOPROLOL SUCCINATE ER 50 MG PO TB24: 50.0000 mg | ORAL_TABLET | Freq: Every day | ORAL | Status: DC

## 2014-04-20 NOTE — Telephone Encounter (Signed)
metoprolo succ  50mg  refilled 90 R-0

## 2014-04-22 ENCOUNTER — Other Ambulatory Visit: Payer: Self-pay | Admitting: Interventional Cardiology

## 2014-04-22 ENCOUNTER — Ambulatory Visit: Payer: Self-pay | Admitting: Interventional Cardiology

## 2014-04-22 ENCOUNTER — Encounter: Payer: Self-pay | Admitting: Interventional Cardiology

## 2014-04-23 ENCOUNTER — Other Ambulatory Visit: Payer: Self-pay

## 2014-04-26 ENCOUNTER — Other Ambulatory Visit: Payer: Self-pay

## 2014-04-26 MED ORDER — INDAPAMIDE 1.25 MG PO TABS: 1.2500 mg | ORAL_TABLET | Freq: Every day | ORAL | Status: DC

## 2014-05-12 ENCOUNTER — Encounter: Payer: Self-pay | Admitting: Interventional Cardiology

## 2014-05-12 DIAGNOSIS — J309 Allergic rhinitis, unspecified: Secondary | ICD-10-CM | POA: Diagnosis not present

## 2014-05-14 ENCOUNTER — Ambulatory Visit (INDEPENDENT_AMBULATORY_CARE_PROVIDER_SITE_OTHER): Payer: Medicare Other | Admitting: Interventional Cardiology

## 2014-05-14 ENCOUNTER — Encounter: Payer: Self-pay | Admitting: Interventional Cardiology

## 2014-05-14 VITALS — BP 137/80 | HR 55 | Ht 67.0 in | Wt 166.0 lb

## 2014-05-14 DIAGNOSIS — I10 Essential (primary) hypertension: Secondary | ICD-10-CM | POA: Diagnosis not present

## 2014-05-14 DIAGNOSIS — I25810 Atherosclerosis of coronary artery bypass graft(s) without angina pectoris: Secondary | ICD-10-CM | POA: Diagnosis not present

## 2014-05-14 DIAGNOSIS — E118 Type 2 diabetes mellitus with unspecified complications: Secondary | ICD-10-CM | POA: Diagnosis not present

## 2014-05-14 DIAGNOSIS — E785 Hyperlipidemia, unspecified: Secondary | ICD-10-CM

## 2014-05-14 NOTE — Patient Instructions (Signed)
Your physician recommends that you continue on your current medications as directed. Please refer to the Current Medication list given to you today.  Your physician discussed the importance of regular exercise and recommended that you continue a regular exercise program for good health.  Your physician wants you to follow-up in: ONE YEAR  receive a reminder letter in the mail two months in advance. If you don't receive a letter, please call our office to schedule the follow-up appointment.

## 2014-05-14 NOTE — Progress Notes (Signed)
Patient ID: MAVEN VARELAS, male   DOB: 1941-08-25, 73 y.o.   MRN: [id number]    [address], [address] [place], [place]  [address] Phone: [phone] Fax:  [phone]  Date:  05/14/2014   ID:  BEULAH MATUSEK, DOB 26-Jan-1941, MRN [id number]  PCP:  No primary provider on file.   ASSESSMENT:  1. Coronary artery disease, stable with history of bypass surgery 1997 2. Hypertension, essential, controlled 3. Hyperlipidemia 4. Diabetes mellitus  PLAN:  1. Continue current active lifestyle 2. No change in medical regimen 3. Call if discomfort or dyspnea   SUBJECTIVE: KRATOS RUSCITTI is a 73 y.o. male who is doing well. 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 He denies palpitations, syncope, orthopnea, PND, nitroglycerin use, edema,  Claudication, and neurological complaints.   Wt Readings from Last 3 Encounters:  05/14/14 166 lb (75.297 kg)  02/05/14 168 lb 1.6 oz (76.25 kg)  10/06/13 168 lb 6.4 oz (76.386 kg)     Past Medical History  Diagnosis Date  . Hyperlipidemia     Good control  . Diabetes mellitus type 2, uncontrolled 11/2012  . CAD (coronary artery disease)     CABG in 2008 with LIMA to LAD, SVG to Diag., SVG to OM1 and 2, SVG to PDA and PLOM, angioplasty x2 in 1990s, Dr. Tamala Julian.  . Essential hypertension, benign     Good control  . BPH (benign prostatic hyperplasia)   . ED (erectile dysfunction)   . Allergic rhinitis     Immunotherapy, Bardelas  . IBS (irritable bowel syndrome)     Hx of, with constipation  . Constipation   . Hemorrhoids   . Coronary atherosclerosis of native coronary artery     Current Outpatient Prescriptions  Medication Sig Dispense Refill  . aspirin 81 MG tablet Take 81 mg by mouth daily.       . Cholecalciferol (VITAMIN D) 400 UNITS capsule Take 1,000 capsules by mouth daily.      Marland Kitchen glucose blood test strip 1 each by Other route as needed for other (VERIGOLD). Use as instructed      . indapamide (LOZOL) 1.25 MG tablet  Take 1 tablet (1.25 mg total) by mouth daily.  90 tablet  0  . Lancets MISC by Does not apply route.      . metoprolol succinate (TOPROL-XL) 50 MG 24 hr tablet Take 1 tablet (50 mg total) by mouth daily. Take with or immediately following a meal.  90 tablet  0  . Multiple Vitamins-Minerals (MULTIVITAMIN PO) Take by mouth daily.      . nitroGLYCERIN (NITROSTAT) 0.4 MG SL tablet Place 0.4 mg under the tongue every 5 (five) minutes as needed for chest pain.      . Omega-3 Fatty Acids (FISH OIL) 1200 MG CAPS Take 1,200 mg by mouth daily.      . pravastatin (PRAVACHOL) 40 MG tablet Take 1 tablet (40 mg total) by mouth daily.  90 tablet  1  . valsartan (DIOVAN) 320 MG tablet Take 1 tablet (320 mg total) by mouth daily.  90 tablet  0  . vardenafil (LEVITRA) 20 MG tablet Take 20 mg by mouth daily as needed for erectile dysfunction.       No current facility-administered medications for this visit.    Allergies:    Allergies  Allergen Reactions  . Sulfur   . Sulfa Antibiotics Rash         Social History:  The patient  reports that he quit smoking about 46 years ago. His smoking use included Cigarettes. He smoked 0.00 packs per day. He has never used smokeless tobacco. He reports that he drinks alcohol.   ROS:  Please see the history of present illness.   No medication side effects   All other systems reviewed and negative.   OBJECTIVE: VS:  BP 137/80  Pulse 55  Ht 5\' 7"  (1.702 m)  Wt 166 lb (75.297 kg)  BMI 25.99 kg/m2 Well nourished, well developed, in no acute distress, younger than stated age 58: normal Neck: JVD flat. Carotid bruit absent  Cardiac:  normal S1, S2; RRR; no murmur. An S4 gallop is audible Lungs:  clear to auscultation bilaterally, no wheezing, rhonchi or rales Abd: soft, nontender, no hepatomegaly Ext: Edema absent. Pulses strong 2+ and symmetric throughout with absent right posterior tibial Skin: warm and dry Neuro:  CNs 2-12 intact, no focal abnormalities  noted  EKG:  Sinus bradycardia, otherwise       Signed, Illene Labrador III, MD 05/14/2014 11:18 AM

## 2014-06-03 DIAGNOSIS — J309 Allergic rhinitis, unspecified: Secondary | ICD-10-CM | POA: Diagnosis not present

## 2014-06-10 DIAGNOSIS — J309 Allergic rhinitis, unspecified: Secondary | ICD-10-CM | POA: Diagnosis not present

## 2014-06-18 DIAGNOSIS — J309 Allergic rhinitis, unspecified: Secondary | ICD-10-CM | POA: Diagnosis not present

## 2014-06-18 DIAGNOSIS — L659 Nonscarring hair loss, unspecified: Secondary | ICD-10-CM | POA: Diagnosis not present

## 2014-06-18 DIAGNOSIS — D235 Other benign neoplasm of skin of trunk: Secondary | ICD-10-CM | POA: Diagnosis not present

## 2014-06-18 DIAGNOSIS — L219 Seborrheic dermatitis, unspecified: Secondary | ICD-10-CM | POA: Diagnosis not present

## 2014-06-24 DIAGNOSIS — J309 Allergic rhinitis, unspecified: Secondary | ICD-10-CM | POA: Diagnosis not present

## 2014-06-24 DIAGNOSIS — J45909 Unspecified asthma, uncomplicated: Secondary | ICD-10-CM | POA: Diagnosis not present

## 2014-06-30 DIAGNOSIS — J309 Allergic rhinitis, unspecified: Secondary | ICD-10-CM | POA: Diagnosis not present

## 2014-07-16 DIAGNOSIS — E119 Type 2 diabetes mellitus without complications: Secondary | ICD-10-CM | POA: Diagnosis not present

## 2014-07-16 DIAGNOSIS — I10 Essential (primary) hypertension: Secondary | ICD-10-CM | POA: Diagnosis not present

## 2014-07-16 DIAGNOSIS — Z23 Encounter for immunization: Secondary | ICD-10-CM | POA: Diagnosis not present

## 2014-07-16 DIAGNOSIS — Z111 Encounter for screening for respiratory tuberculosis: Secondary | ICD-10-CM | POA: Diagnosis not present

## 2014-07-19 ENCOUNTER — Other Ambulatory Visit: Payer: Self-pay | Admitting: Interventional Cardiology

## 2014-07-26 DIAGNOSIS — J309 Allergic rhinitis, unspecified: Secondary | ICD-10-CM | POA: Diagnosis not present

## 2014-08-18 ENCOUNTER — Other Ambulatory Visit: Payer: Self-pay | Admitting: Interventional Cardiology

## 2014-08-23 DIAGNOSIS — J309 Allergic rhinitis, unspecified: Secondary | ICD-10-CM | POA: Diagnosis not present

## 2014-08-24 DIAGNOSIS — H40013 Open angle with borderline findings, low risk, bilateral: Secondary | ICD-10-CM | POA: Diagnosis not present

## 2014-08-24 DIAGNOSIS — H2513 Age-related nuclear cataract, bilateral: Secondary | ICD-10-CM | POA: Diagnosis not present

## 2014-09-01 DIAGNOSIS — J309 Allergic rhinitis, unspecified: Secondary | ICD-10-CM | POA: Diagnosis not present

## 2014-09-13 ENCOUNTER — Other Ambulatory Visit: Payer: Self-pay

## 2014-09-13 DIAGNOSIS — E785 Hyperlipidemia, unspecified: Secondary | ICD-10-CM

## 2014-09-16 ENCOUNTER — Other Ambulatory Visit (INDEPENDENT_AMBULATORY_CARE_PROVIDER_SITE_OTHER): Payer: Medicare Other | Admitting: *Deleted

## 2014-09-16 DIAGNOSIS — E785 Hyperlipidemia, unspecified: Secondary | ICD-10-CM

## 2014-09-16 LAB — LIPID PANEL
CHOL/HDL RATIO: 3
CHOLESTEROL: 156 mg/dL (ref 0–200)
HDL: 50.5 mg/dL (ref >39.00)
LDL Cholesterol: 83 mg/dL (ref 0–99)
NonHDL: 105.5
TRIGLYCERIDES: 115 mg/dL (ref 0.0–149.0)
VLDL: 23 mg/dL (ref 0.0–40.0)

## 2014-09-16 LAB — ALT: ALT: 26 U/L (ref 0–53)

## 2014-09-20 DIAGNOSIS — J309 Allergic rhinitis, unspecified: Secondary | ICD-10-CM | POA: Diagnosis not present

## 2014-09-21 ENCOUNTER — Telehealth: Payer: Self-pay

## 2014-09-21 NOTE — Telephone Encounter (Signed)
Called to give pt lab results.lmtcb  

## 2014-09-21 NOTE — Telephone Encounter (Signed)
-----   Message from [place], MD sent at 09/20/2014  5:50 PM EST ----- Cholesterol levels and liver function normal/and range. Continue current therapy. Repeat in one year

## 2014-10-04 ENCOUNTER — Other Ambulatory Visit: Payer: Self-pay | Admitting: Interventional Cardiology

## 2014-10-11 ENCOUNTER — Encounter: Payer: Self-pay | Admitting: Interventional Cardiology

## 2014-10-11 ENCOUNTER — Other Ambulatory Visit: Payer: Self-pay | Admitting: Interventional Cardiology

## 2014-10-28 DIAGNOSIS — J309 Allergic rhinitis, unspecified: Secondary | ICD-10-CM | POA: Diagnosis not present

## 2014-11-23 DIAGNOSIS — H40013 Open angle with borderline findings, low risk, bilateral: Secondary | ICD-10-CM | POA: Diagnosis not present

## 2014-11-23 DIAGNOSIS — H2513 Age-related nuclear cataract, bilateral: Secondary | ICD-10-CM | POA: Diagnosis not present

## 2014-11-23 DIAGNOSIS — E119 Type 2 diabetes mellitus without complications: Secondary | ICD-10-CM | POA: Diagnosis not present

## 2014-11-23 DIAGNOSIS — H524 Presbyopia: Secondary | ICD-10-CM | POA: Diagnosis not present

## 2014-11-25 DIAGNOSIS — J309 Allergic rhinitis, unspecified: Secondary | ICD-10-CM | POA: Diagnosis not present

## 2014-12-28 DIAGNOSIS — J309 Allergic rhinitis, unspecified: Secondary | ICD-10-CM | POA: Diagnosis not present

## 2015-01-07 DIAGNOSIS — J309 Allergic rhinitis, unspecified: Secondary | ICD-10-CM | POA: Diagnosis not present

## 2015-01-13 DIAGNOSIS — J309 Allergic rhinitis, unspecified: Secondary | ICD-10-CM | POA: Diagnosis not present

## 2015-01-20 DIAGNOSIS — J309 Allergic rhinitis, unspecified: Secondary | ICD-10-CM | POA: Diagnosis not present

## 2015-01-28 DIAGNOSIS — J309 Allergic rhinitis, unspecified: Secondary | ICD-10-CM | POA: Diagnosis not present

## 2015-02-15 DIAGNOSIS — E119 Type 2 diabetes mellitus without complications: Secondary | ICD-10-CM | POA: Diagnosis not present

## 2015-02-15 DIAGNOSIS — Z0000 Encounter for general adult medical examination without abnormal findings: Secondary | ICD-10-CM | POA: Diagnosis not present

## 2015-02-15 DIAGNOSIS — Z1389 Encounter for screening for other disorder: Secondary | ICD-10-CM | POA: Diagnosis not present

## 2015-02-15 DIAGNOSIS — N5201 Erectile dysfunction due to arterial insufficiency: Secondary | ICD-10-CM | POA: Diagnosis not present

## 2015-02-15 DIAGNOSIS — E780 Pure hypercholesterolemia: Secondary | ICD-10-CM | POA: Diagnosis not present

## 2015-02-15 DIAGNOSIS — I10 Essential (primary) hypertension: Secondary | ICD-10-CM | POA: Diagnosis not present

## 2015-02-23 DIAGNOSIS — J309 Allergic rhinitis, unspecified: Secondary | ICD-10-CM | POA: Diagnosis not present

## 2015-03-23 DIAGNOSIS — J309 Allergic rhinitis, unspecified: Secondary | ICD-10-CM | POA: Diagnosis not present

## 2015-04-06 DIAGNOSIS — J309 Allergic rhinitis, unspecified: Secondary | ICD-10-CM | POA: Diagnosis not present

## 2015-04-13 ENCOUNTER — Other Ambulatory Visit: Payer: Self-pay | Admitting: Interventional Cardiology

## 2015-04-25 ENCOUNTER — Other Ambulatory Visit: Payer: Self-pay

## 2015-04-29 DIAGNOSIS — J309 Allergic rhinitis, unspecified: Secondary | ICD-10-CM | POA: Diagnosis not present

## 2015-05-24 DIAGNOSIS — J309 Allergic rhinitis, unspecified: Secondary | ICD-10-CM | POA: Diagnosis not present

## 2015-06-05 NOTE — Progress Notes (Signed)
Cardiology Office Note   Date:  06/07/2015   ID:  MAKOTO SELLITTO, Alferd Apa Mar 25, 1941, MRN [id number]  PCP:  Irven Shelling, MD  Cardiologist:  Sinclair Grooms, MD   Chief Complaint  Patient presents with  . Coronary Artery Disease      History of Present Illness: [name] [name] is a 74 y.o. male who presents for CABG, hypertension, DM2, and hyperlipidemia.  Denies angina. Still jog some time to time. Exercises very regularly. No dyspnea or chest discomfort limited activity. He has not had palpitations. No transient neurological complaints.  Past Medical History  Diagnosis Date  . Hyperlipidemia     Good control  . Diabetes mellitus type 2, uncontrolled 11/2012  . CAD (coronary artery disease)     CABG in 2008 with LIMA to LAD, SVG to Diag., SVG to OM1 and 2, SVG to PDA and PLOM, angioplasty x2 in 1990s, Dr. Tamala Julian.  . Essential hypertension, benign     Good control  . BPH (benign prostatic hyperplasia)   . ED (erectile dysfunction)   . Allergic rhinitis     Immunotherapy, [place]  . IBS (irritable bowel syndrome)     Hx of, with constipation  . Constipation   . Hemorrhoids   . Coronary atherosclerosis of native coronary artery     Past Surgical History  Procedure Laterality Date  . Coronary artery bypass graft  2008    With LIMA to LAD, SVG to Diag., SVG to OM1 and 2, SVG to PDA and PLOM, angioplasty x2 in 1990s, Dr. Tamala Julian.  . Coronary angioplasty with stent placement  1990s    Angioplasty x2 in 1990s, Dr. Tamala Julian.     Current Outpatient Prescriptions  Medication Sig Dispense Refill  . aspirin 81 MG tablet Take 81 mg by mouth daily.     . Cholecalciferol (VITAMIN D) 400 UNITS capsule Take 1,000 capsules by mouth daily.    . clobetasol (TEMOVATE) 0.05 % external solution Apply 1 application topically daily as needed (DANRUFF).     . fluticasone (FLONASE) 50 MCG/ACT nasal spray Place 1 spray into both nostrils daily.    Marland Kitchen glucose blood test strip 1 each by Other  route as needed for other (VERIGOLD). Use as instructed    . indapamide (LOZOL) 1.25 MG tablet TAKE 1 TABLET DAILY 90 tablet 0  . Lancets MISC by Does not apply route.    . Multiple Vitamins-Minerals (MULTIVITAMIN PO) Take 1 tablet by mouth daily.     . nitroGLYCERIN (NITROSTAT) 0.4 MG SL tablet Place 0.4 mg under the tongue every 5 (five) minutes as needed for chest pain.    . Omega-3 Fatty Acids (FISH OIL) 1200 MG CAPS Take 1,200 mg by mouth daily.    Marland Kitchen OVER THE COUNTER MEDICATION Take 1 tablet by mouth 2 (two) times daily. (SUPER BETA PROSTATE)    . pravastatin (PRAVACHOL) 40 MG tablet TAKE 1 TABLET (40 MG TOTAL) BY MOUTH DAILY. 90 tablet 3  . valsartan (DIOVAN) 320 MG tablet TAKE 1 TABLET DAILY 90 tablet 0  . vardenafil (LEVITRA) 20 MG tablet Take 20 mg by mouth daily as needed for erectile dysfunction.     No current facility-administered medications for this visit.    Allergies:   Sulfa antibiotics and Sulfur    Social History:  The patient  reports that he quit smoking about 47 years ago. His smoking use included Cigarettes. He has never used smokeless tobacco. He reports that he drinks alcohol.  Family History:  The patient's family history includes Asthma in his mother; CAD in his father; Colon cancer in his maternal aunt; Heart attack in his father.    ROS:  Please see the history of present illness.   Otherwise, review of systems are positive for having joint and muscle aches. Noted blood pressure was a little higher today. Vivid dreams at night. Frequent urination at night..   All other systems are reviewed and negative.    PHYSICAL EXAM: VS:  BP 146/86 mmHg  Pulse 53  Ht 5\' 7"  (1.702 m)  Wt 76.114 kg (167 lb 12.8 oz)  BMI 26.28 kg/m2 , BMI Body mass index is 26.28 kg/(m^2). GEN: Well nourished, well developed, in no acute distress. Appears younger than stated age. HEENT: normal Neck: no JVD, carotid bruits, or masses Cardiac: RRR; no murmurs, rubs, or gallops,no edema    Respiratory:  clear to auscultation bilaterally, normal work of breathing GI: soft, nontender, nondistended, + BS MS: no deformity or atrophy Skin: warm and dry, no rash Neuro:  Strength and sensation are intact Psych: euthymic mood, full affect   EKG:  EKG is ordered today. The ekg ordered today demonstrates sinus bradycardia at 53 bpm. Nonspecific T wave flattening.   Recent Labs: 09/16/2014: ALT 26    Lipid Panel    Component Value Date/Time   CHOL 156 09/16/2014 0928   TRIG 115.0 09/16/2014 0928   HDL 50.50 09/16/2014 0928   CHOLHDL 3 09/16/2014 0928   VLDL 23.0 09/16/2014 0928   LDLCALC 83 09/16/2014 0928      Wt Readings from Last 3 Encounters:  06/07/15 76.114 kg (167 lb 12.8 oz)  05/14/14 75.297 kg (166 lb)  02/05/14 76.25 kg (168 lb 1.6 oz)      Other studies Reviewed: Additional studies/ records that were reviewed today include: laboratory data including lipids were in range when last evaluated in November.. Review of the above records demonstrates: no outstanding new data of concern.   ASSESSMENT AND PLAN:  1. Coronary artery disease involving coronary bypass graft of native heart without angina pectoris  Denies angina  2. Essential hypertension  Elevated today but he keeps a very consistent record at home with systolics running usually less than 4:96 and diastolics less than 85 mmHg  3. Hyperlipidemia  LDL was 83 and November 2015 with a total cholesterol of 153  4. Type 2 diabetes mellitus with complications  Last hemoglobin A1c was 6.7    Current medicines are reviewed at length with the patient today.  The patient does not have concerns regarding medicine regimen.  The following changes have been made:  We will temporarily switched to By- systolic 10 mg per day and discontinue metoprolol to see if this improves  Labs/ tests ordered today include:  No orders of the defined types were placed in this encounter.     Disposition:   FU  with HS in 1 years  Signed, Sinclair Grooms, MD  06/07/2015 9:47 AM    [address], [place], [place]  [address] Phone: [phone]; Fax: [phone]

## 2015-06-07 ENCOUNTER — Ambulatory Visit (INDEPENDENT_AMBULATORY_CARE_PROVIDER_SITE_OTHER): Payer: Medicare Other | Admitting: Interventional Cardiology

## 2015-06-07 ENCOUNTER — Encounter: Payer: Self-pay | Admitting: Interventional Cardiology

## 2015-06-07 VITALS — BP 146/86 | HR 53 | Ht 67.0 in | Wt 167.8 lb

## 2015-06-07 DIAGNOSIS — E785 Hyperlipidemia, unspecified: Secondary | ICD-10-CM

## 2015-06-07 DIAGNOSIS — E118 Type 2 diabetes mellitus with unspecified complications: Secondary | ICD-10-CM | POA: Diagnosis not present

## 2015-06-07 DIAGNOSIS — I25810 Atherosclerosis of coronary artery bypass graft(s) without angina pectoris: Secondary | ICD-10-CM

## 2015-06-07 DIAGNOSIS — I10 Essential (primary) hypertension: Secondary | ICD-10-CM | POA: Diagnosis not present

## 2015-06-07 MED ORDER — NEBIVOLOL HCL 10 MG PO TABS: 10.0000 mg | ORAL_TABLET | Freq: Every day | ORAL | Status: DC

## 2015-06-07 NOTE — Patient Instructions (Signed)
Medication Instructions:  Your physician has recommended you make the following change in your medication:  1) STOP Metoprolol 2) START Bystolic 10mg  daily  Labwork: None   Testing/Procedures: None   Follow-Up: Your physician wants you to follow-up in: 1 year with Dr.Smith You will receive a reminder letter in the mail two months in advance. If you don't receive a letter, please call our office to schedule the follow-up appointment.   Any Other Special Instructions Will Be Listed Below (If Applicable). Continue to monitor your blood pressure regularly. Call the office in 2 weeks with and update of symptoms and your blood pressure readings.

## 2015-06-17 ENCOUNTER — Telehealth: Payer: Self-pay | Admitting: Interventional Cardiology

## 2015-06-17 NOTE — Telephone Encounter (Signed)
error 

## 2015-06-17 NOTE — Telephone Encounter (Signed)
Pt called to give an update since switching from Metoprolol to Bystolic  -Pt report that he has not had any improvement ,and still having vivid dreams  -He has been monitoring his bp daily. He has been getting good bp control. His avg bp 127/71 57bpm, his lowest 121/68, his highest 137/74.  Pt was provided samples of Bystolic on 8/9 he has enough to get him through till Mon 8/22. Adv pt that Dr.Snmith is not in the office. Adv pt I will fwd Dr.Smith an update and call back with his recommendation. Pt agreeable with plan and verbalized understanding

## 2015-06-20 DIAGNOSIS — J309 Allergic rhinitis, unspecified: Secondary | ICD-10-CM | POA: Diagnosis not present

## 2015-06-20 NOTE — Telephone Encounter (Signed)
Pt called back for further instruction from [place]. Adv pt that I have not heard back form him. Dr.Smith is out of the office and will return after 8/25. I will provide pt enough bystolic samples, until we get a response from Dr.Smith.  Pt aware samples of bystolic 10mg  qd. Left at the front desk for pt to pick up. Message routed to [place]

## 2015-06-20 NOTE — Telephone Encounter (Signed)
Okay to go back to metoprolol as before. Meds probably not causing vivid dreams.

## 2015-06-21 MED ORDER — INDAPAMIDE 1.25 MG PO TABS: 1.2500 mg | ORAL_TABLET | Freq: Every day | ORAL | Status: DC

## 2015-06-21 MED ORDER — METOPROLOL SUCCINATE ER 50 MG PO TB24: 50.0000 mg | ORAL_TABLET | Freq: Every day | ORAL | Status: DC

## 2015-06-21 MED ORDER — VALSARTAN 320 MG PO TABS: 320.0000 mg | ORAL_TABLET | Freq: Every day | ORAL | Status: DC

## 2015-06-21 NOTE — Telephone Encounter (Signed)
Pt returning [place] phone call

## 2015-06-21 NOTE — Telephone Encounter (Signed)
Called to give pt Dr.Smith's response.lmtcb 

## 2015-06-21 NOTE — Telephone Encounter (Signed)
Pt aware of Dr.Smith's recommendation. Okay to go back to metoprolol as before. Meds probably not causing vivid dreams. Pt rqst refills for Metoprolol, Valsartan, Lozol. Rx sent to pt mail order pharmacy. Pt verbalized understanding.

## 2015-06-23 DIAGNOSIS — J4520 Mild intermittent asthma, uncomplicated: Secondary | ICD-10-CM | POA: Diagnosis not present

## 2015-06-23 DIAGNOSIS — J309 Allergic rhinitis, unspecified: Secondary | ICD-10-CM | POA: Diagnosis not present

## 2015-07-09 DIAGNOSIS — H1010 Acute atopic conjunctivitis, unspecified eye: Secondary | ICD-10-CM | POA: Insufficient documentation

## 2015-07-09 DIAGNOSIS — J309 Allergic rhinitis, unspecified: Secondary | ICD-10-CM | POA: Insufficient documentation

## 2015-07-27 ENCOUNTER — Ambulatory Visit (INDEPENDENT_AMBULATORY_CARE_PROVIDER_SITE_OTHER): Payer: Federal, State, Local not specified - PPO

## 2015-07-27 DIAGNOSIS — J309 Allergic rhinitis, unspecified: Secondary | ICD-10-CM

## 2015-07-27 DIAGNOSIS — L219 Seborrheic dermatitis, unspecified: Secondary | ICD-10-CM | POA: Diagnosis not present

## 2015-07-31 DIAGNOSIS — J40 Bronchitis, not specified as acute or chronic: Secondary | ICD-10-CM | POA: Diagnosis not present

## 2015-08-09 ENCOUNTER — Other Ambulatory Visit: Payer: Self-pay

## 2015-08-09 ENCOUNTER — Encounter: Payer: Self-pay | Admitting: Interventional Cardiology

## 2015-08-09 DIAGNOSIS — E785 Hyperlipidemia, unspecified: Secondary | ICD-10-CM

## 2015-08-12 ENCOUNTER — Encounter: Payer: Self-pay | Admitting: Allergy and Immunology

## 2015-08-12 ENCOUNTER — Ambulatory Visit (INDEPENDENT_AMBULATORY_CARE_PROVIDER_SITE_OTHER): Payer: Medicare Other | Admitting: Neurology

## 2015-08-12 ENCOUNTER — Ambulatory Visit (INDEPENDENT_AMBULATORY_CARE_PROVIDER_SITE_OTHER): Payer: Medicare Other | Admitting: Allergy and Immunology

## 2015-08-12 VITALS — BP 136/72 | HR 72 | Temp 98.2°F | Resp 16

## 2015-08-12 DIAGNOSIS — R059 Cough, unspecified: Secondary | ICD-10-CM

## 2015-08-12 DIAGNOSIS — H1010 Acute atopic conjunctivitis, unspecified eye: Secondary | ICD-10-CM | POA: Diagnosis not present

## 2015-08-12 DIAGNOSIS — R05 Cough: Secondary | ICD-10-CM | POA: Diagnosis not present

## 2015-08-12 DIAGNOSIS — I25810 Atherosclerosis of coronary artery bypass graft(s) without angina pectoris: Secondary | ICD-10-CM | POA: Diagnosis not present

## 2015-08-12 DIAGNOSIS — J309 Allergic rhinitis, unspecified: Secondary | ICD-10-CM

## 2015-08-12 DIAGNOSIS — R062 Wheezing: Secondary | ICD-10-CM

## 2015-08-12 MED ORDER — BECLOMETHASONE DIPROPIONATE 80 MCG/ACT IN AERS: 2.0000 | INHALATION_SPRAY | Freq: Two times a day (BID) | RESPIRATORY_TRACT | Status: DC

## 2015-08-12 MED ORDER — ALBUTEROL SULFATE HFA 108 (90 BASE) MCG/ACT IN AERS: 2.0000 | INHALATION_SPRAY | RESPIRATORY_TRACT | Status: DC | PRN

## 2015-08-12 NOTE — Patient Instructions (Addendum)
Take Home Sheet  1. Avoidance: Dust Mite and Mold.   2. Antihistamine: Claritin 10mg  by mouth once daily for runny nose or itching as needed.  3. Nasal Spray: Dymista 1 spray(s) each nostril twice daily for stuffy nose or drainage.   4. Inhalers:  Rescue: ProAir 2 puffs every 4 hours as needed for cough or wheeze.       -May use 2 puffs 10-20 minutes prior to exercise.   Preventative: QVAR 86mcg 2 puffs twice daily (Rinse, gargle, and spit out after use) for 3 weeks than decrease to 2 puffs each morning.  5. Continue Singulair 10mg  each evening.   6. Prednisone 20 mg once daily for 3 days.   7. Nasal Saline wash each evening at bath/shower time.   8.  Epi-pen/Benadryl as needed.    9.  Follow up Visit: 3-4 months or sooner if needed.    Call the office with an update in 3 weeks.  Websites that have reliable Patient information: 1. American Academy of Asthma, Allergy, & Immunology: www.aaaai.org 2. Food Allergy Network: www.foodallergy.org 3. Mothers of Asthmatics: www.aanma.org 4. [place]: DiningCalendar.de 5. American College of Allergy, Asthma, & Immunology: https://robertson.info/ or www.acaai.org  Reducing Pollen Exposure  The American Academy of Allergy, Asthma and Immunology suggests the following steps to reduce your exposure to pollen during allergy seasons.  1. Do not hang sheets or clothing out to dry; pollen may collect on these items. 2. Do not mow lawns or spend time around freshly cut grass; mowing stirs up pollen. 3. Keep windows closed at night.  Keep car windows closed while driving. 4. Minimize morning activities outdoors, a time when pollen counts are usually at their highest. 5. Stay indoors as much as possible when pollen counts or humidity is high and on windy days when pollen tends to remain in the air longer. 6. Use air conditioning when possible.  Many air conditioners have filters that trap the pollen spores. 7. Use  a HEPA room air filter to remove pollen form the indoor air you breathe.  Control of Mold Allergen  Mold and fungi can grow on a variety of surfaces provided certain temperature and moisture conditions exist.  Outdoor molds grow on plants, decaying vegetation and soil.  The major outdoor mold, Alternaria dn Cladosporium, are found in very high numbers during hot and dry conditions.  Generally, a late Summer - Fall peak is seen for common outdoor fungal spores.  Rain will temporarily lower outdoor mold spore count, but counts rise rapidly when the rainy period ends.  The most important indoor molds are Aspergillus and Penicillium.  Dark, humid and poorly ventilated basements are ideal sites for mold growth.  The next most common sites of mold growth are the bathroom and the kitchen.  Outdoor [employer] 1. Use air conditioning and keep windows closed 2. Avoid exposure to decaying vegetation. 3. Avoid leaf raking. 4. Avoid grain handling. 5. Consider wearing a face mask if working in moldy areas.  Indoor Mold Control 1. Maintain humidity below 50%. 2. Clean washable surfaces with 5% bleach solution. 3. Remove sources e.g. Contaminated carpets.    Control of House Dust Mite Allergen  House dust mites play a major role in allergic asthma and rhinitis.  They occur in environments with high humidity wherever human skin, the food for dust mites is found. High levels have been detected in dust obtained from mattresses, pillows, carpets, upholstered furniture, bed covers, clothes and soft toys.  The principal allergen of the house dust mite is found in its feces.  A gram of dust may contain 1,000 mites and 250,000 fecal particles.  Mite antigen is easily measured in the air during house cleaning activities.  8. Encase mattresses, including the box spring, and pillow, in an air tight cover.  Seal the zipper end of the encased mattresses with wide adhesive tape. 9. Wash the bedding in water of 130  degrees Farenheit weekly.  Avoid cotton comforters/quilts and flannel bedding: the most ideal bed covering is the dacron comforter. 10. Remove all upholstered furniture from the bedroom. 11. Remove carpets, carpet padding, rugs, and non-washable window drapes from the bedroom.  Wash drapes weekly or use plastic window coverings. 12. Remove all non-washable stuffed toys from the bedroom.  Wash stuffed toys weekly. 13. Have the room cleaned frequently with a vacuum cleaner and a damp dust-mop.  The patient should not be in a room which is being cleaned and should wait 1 hour after cleaning before going into the room. 14. Close and seal all heating outlets in the bedroom.  Otherwise, the room will become filled with dust-laden air.  An electric heater can be used to heat the room. 15. Reduce indoor humidity to less than 50%.  Do not use a humidifier.

## 2015-08-12 NOTE — Progress Notes (Signed)
FOLLOW UP NOTE  RE: [name] [name] MRN: [id number] DOB: [birth year] [facility] [place] [address] Date of Office Visit: 08/12/2015  Subjective:  [name] [name] is a 74 y.o. male who presents today for Asthma   HPI: [name] [name] returns to the office in follow-up of asthma and allergic rhinoconjunctivitis.  Since his last visit in August, he reports some improvement.  However, earlier this month he noted increasing congestion and cough, without fever, headache, sore throat, discolored drainage, or other associated symptoms,  which prompted an urgent care visit on October 1st.  He has been using guaifenesin which is greater than 75% helpful and the Dymista is definitely beneficial.  He is not sure he noted any wheezing.  Did use ProAir once or twice and thought it was helpful as well as the Singulair for nasal symptoms.  Denies difficulty in breathing or shortness of breath, but occasionally has a cough.  There have been no emergency department visits, antibiotics or prednisone courses or new medical issues.  Drug Allergies: Allergies  Allergen Reactions  . Sulfa Antibiotics Rash       . Sulfur Rash    Objective:   Filed Vitals:   08/12/15 1031  BP: 136/72  Pulse: 72  Temp: 98.2 F (36.8 C)  Resp: 16   Physical Exam  Constitutional: He is well-developed, well-nourished, and in no distress.  HENT:  Head: Atraumatic.  Right Ear: Tympanic membrane and ear canal normal.  Left Ear: Tympanic membrane and ear canal normal.  Nose: Mucosal edema present. No rhinorrhea. No epistaxis.  Mouth/Throat: Oropharynx is clear and moist and mucous membranes are normal. No oropharyngeal exudate, posterior oropharyngeal edema or posterior oropharyngeal erythema.  Neck: Neck supple.  Cardiovascular: Normal rate, S1 normal and S2 normal.   No murmur heard. Pulmonary/Chest: Effort normal and breath sounds normal. He has  no wheezes. He has no rhonchi. He has no rales.  Lymphadenopathy:    He has no cervical adenopathy.    Diagnostics: FVC 3.59--107%, FEV1 2.83--114%.  Assessment:   1. History of Cough/wheeze consistent mild persistent asthma with normal office spirometry.  2.  Allergic rhinoconjunctivitis on immunotherapy.     3. Complex medical history including hypertension on beta-blocker.   Plan:   Meds ordered this encounter  Medications  . beclomethasone (QVAR) 80 MCG/ACT inhaler    Sig: Inhale 2 puffs into the lungs 2 (two) times daily. To prevent cough or wheeze. Rinse, gargle, and spit out after use    Dispense:  1 Inhaler    Refill:  0    Order Specific Question:  Lot Number?    Answer:  [id number]    Order Specific Question:  Quantity    Answer:  1   Patient Instructions  Take Home Sheet  1. Avoidance: Dust Mite and Mold Information given today in review.   2. Antihistamine: Claritin 10mg  by mouth once daily for runny nose or itching as needed.  3. Nasal Spray: Dymista 1 spray(s) each nostril twice daily for stuffy nose or drainage.   4. Inhalers:  Rescue: ProAir 2 puffs every 4 hours as needed for cough or wheeze.       -May use 2 puffs 10-20 minutes prior to exercise.   Preventative: QVAR 52mcg 2 puffs twice daily (Rinse, gargle, and spit out after use) for 3 weeks than decrease to 2 puffs each morning.  5. Continue Singulair 10mg  each evening.  6. Prednisone 20 mg once daily for 3 days.   7. Nasal Saline wash each evening at bath/shower time.   8.  Epi-pen/Benadryl as needed.    9.  Follow up Visit: 3-4 months or sooner if needed.    Call the office with an update in 3 weeks.  Websites that have reliable Patient information: 1. American Academy of Asthma, Allergy, & Immunology: www.aaaai.org 2. Food Allergy Network: www.foodallergy.org 3. Mothers of Asthmatics: www.aanma.org 4. [place]: DiningCalendar.de 5. American College of  Allergy, Asthma, & Immunology: https://robertson.info/ or www.acaai.org      Kalen Ratajczak M. Ishmael Holter, MD  cc: Lavone Orn, MD

## 2015-08-22 DIAGNOSIS — N529 Male erectile dysfunction, unspecified: Secondary | ICD-10-CM | POA: Diagnosis not present

## 2015-08-22 DIAGNOSIS — Z23 Encounter for immunization: Secondary | ICD-10-CM | POA: Diagnosis not present

## 2015-08-22 DIAGNOSIS — J069 Acute upper respiratory infection, unspecified: Secondary | ICD-10-CM | POA: Diagnosis not present

## 2015-08-22 DIAGNOSIS — I10 Essential (primary) hypertension: Secondary | ICD-10-CM | POA: Diagnosis not present

## 2015-08-22 DIAGNOSIS — E119 Type 2 diabetes mellitus without complications: Secondary | ICD-10-CM | POA: Diagnosis not present

## 2015-08-24 ENCOUNTER — Ambulatory Visit (INDEPENDENT_AMBULATORY_CARE_PROVIDER_SITE_OTHER): Payer: Medicare Other | Admitting: Neurology

## 2015-08-24 DIAGNOSIS — J309 Allergic rhinitis, unspecified: Secondary | ICD-10-CM

## 2015-09-06 ENCOUNTER — Ambulatory Visit (INDEPENDENT_AMBULATORY_CARE_PROVIDER_SITE_OTHER): Payer: Medicare Other | Admitting: *Deleted

## 2015-09-06 DIAGNOSIS — J309 Allergic rhinitis, unspecified: Secondary | ICD-10-CM | POA: Diagnosis not present

## 2015-09-14 ENCOUNTER — Ambulatory Visit (INDEPENDENT_AMBULATORY_CARE_PROVIDER_SITE_OTHER): Payer: Medicare Other

## 2015-09-14 DIAGNOSIS — J309 Allergic rhinitis, unspecified: Secondary | ICD-10-CM | POA: Diagnosis not present

## 2015-09-20 ENCOUNTER — Ambulatory Visit (INDEPENDENT_AMBULATORY_CARE_PROVIDER_SITE_OTHER): Payer: Medicare Other

## 2015-09-20 DIAGNOSIS — J309 Allergic rhinitis, unspecified: Secondary | ICD-10-CM

## 2015-09-20 DIAGNOSIS — L304 Erythema intertrigo: Secondary | ICD-10-CM | POA: Diagnosis not present

## 2015-09-27 ENCOUNTER — Other Ambulatory Visit: Payer: Self-pay | Admitting: Interventional Cardiology

## 2015-09-28 ENCOUNTER — Encounter: Payer: Self-pay | Admitting: Interventional Cardiology

## 2015-09-28 ENCOUNTER — Other Ambulatory Visit: Payer: Medicare Other | Admitting: *Deleted

## 2015-09-28 DIAGNOSIS — I10 Essential (primary) hypertension: Secondary | ICD-10-CM

## 2015-09-28 DIAGNOSIS — I25810 Atherosclerosis of coronary artery bypass graft(s) without angina pectoris: Secondary | ICD-10-CM | POA: Diagnosis not present

## 2015-09-28 DIAGNOSIS — E785 Hyperlipidemia, unspecified: Secondary | ICD-10-CM

## 2015-09-28 LAB — LIPID PANEL
CHOLESTEROL: 138 mg/dL (ref 125–200)
HDL: 46 mg/dL (ref >=40)
LDL Cholesterol: 73 mg/dL (ref <130)
Total CHOL/HDL Ratio: 3 Ratio (ref <=5.0)
Triglycerides: 96 mg/dL (ref <150)
VLDL: 19 mg/dL (ref <30)

## 2015-09-28 LAB — ALT: ALT: 26 U/L (ref 9–46)

## 2015-09-28 NOTE — Addendum Note (Signed)
Addended by: Eulis Foster on: 09/28/2015 09:36 AM   Modules accepted: Orders

## 2015-09-29 ENCOUNTER — Other Ambulatory Visit: Payer: Self-pay

## 2015-09-29 DIAGNOSIS — E785 Hyperlipidemia, unspecified: Secondary | ICD-10-CM

## 2015-10-12 DIAGNOSIS — J3089 Other allergic rhinitis: Secondary | ICD-10-CM | POA: Diagnosis not present

## 2015-10-24 DIAGNOSIS — I25810 Atherosclerosis of coronary artery bypass graft(s) without angina pectoris: Secondary | ICD-10-CM | POA: Diagnosis not present

## 2015-10-24 DIAGNOSIS — R05 Cough: Secondary | ICD-10-CM | POA: Diagnosis not present

## 2015-10-24 DIAGNOSIS — J22 Unspecified acute lower respiratory infection: Secondary | ICD-10-CM | POA: Diagnosis not present

## 2015-10-24 DIAGNOSIS — R509 Fever, unspecified: Secondary | ICD-10-CM | POA: Diagnosis not present

## 2015-10-24 DIAGNOSIS — R062 Wheezing: Secondary | ICD-10-CM | POA: Diagnosis not present

## 2015-11-04 ENCOUNTER — Ambulatory Visit (INDEPENDENT_AMBULATORY_CARE_PROVIDER_SITE_OTHER): Payer: Medicare Other | Admitting: *Deleted

## 2015-11-04 DIAGNOSIS — J309 Allergic rhinitis, unspecified: Secondary | ICD-10-CM | POA: Diagnosis not present

## 2015-11-15 DIAGNOSIS — H25813 Combined forms of age-related cataract, bilateral: Secondary | ICD-10-CM | POA: Diagnosis not present

## 2015-11-15 DIAGNOSIS — H524 Presbyopia: Secondary | ICD-10-CM | POA: Diagnosis not present

## 2015-12-05 ENCOUNTER — Ambulatory Visit (INDEPENDENT_AMBULATORY_CARE_PROVIDER_SITE_OTHER): Payer: Medicare Other

## 2015-12-05 DIAGNOSIS — J309 Allergic rhinitis, unspecified: Secondary | ICD-10-CM | POA: Diagnosis not present

## 2016-01-04 ENCOUNTER — Ambulatory Visit (INDEPENDENT_AMBULATORY_CARE_PROVIDER_SITE_OTHER): Payer: Medicare Other

## 2016-01-04 DIAGNOSIS — J309 Allergic rhinitis, unspecified: Secondary | ICD-10-CM

## 2016-02-01 ENCOUNTER — Ambulatory Visit (INDEPENDENT_AMBULATORY_CARE_PROVIDER_SITE_OTHER): Payer: Medicare Other

## 2016-02-01 DIAGNOSIS — J309 Allergic rhinitis, unspecified: Secondary | ICD-10-CM | POA: Diagnosis not present

## 2016-02-16 DIAGNOSIS — Z1389 Encounter for screening for other disorder: Secondary | ICD-10-CM | POA: Diagnosis not present

## 2016-02-16 DIAGNOSIS — Z125 Encounter for screening for malignant neoplasm of prostate: Secondary | ICD-10-CM | POA: Diagnosis not present

## 2016-02-16 DIAGNOSIS — I10 Essential (primary) hypertension: Secondary | ICD-10-CM | POA: Diagnosis not present

## 2016-02-16 DIAGNOSIS — Z0000 Encounter for general adult medical examination without abnormal findings: Secondary | ICD-10-CM | POA: Diagnosis not present

## 2016-02-16 DIAGNOSIS — E7800 Pure hypercholesterolemia, unspecified: Secondary | ICD-10-CM | POA: Diagnosis not present

## 2016-02-16 DIAGNOSIS — E119 Type 2 diabetes mellitus without complications: Secondary | ICD-10-CM | POA: Diagnosis not present

## 2016-02-29 ENCOUNTER — Ambulatory Visit (INDEPENDENT_AMBULATORY_CARE_PROVIDER_SITE_OTHER): Payer: Medicare Other | Admitting: *Deleted

## 2016-02-29 DIAGNOSIS — J309 Allergic rhinitis, unspecified: Secondary | ICD-10-CM

## 2016-04-06 ENCOUNTER — Ambulatory Visit (INDEPENDENT_AMBULATORY_CARE_PROVIDER_SITE_OTHER): Payer: Medicare Other | Admitting: *Deleted

## 2016-04-06 DIAGNOSIS — J309 Allergic rhinitis, unspecified: Secondary | ICD-10-CM | POA: Diagnosis not present

## 2016-04-12 ENCOUNTER — Ambulatory Visit (INDEPENDENT_AMBULATORY_CARE_PROVIDER_SITE_OTHER): Payer: Medicare Other

## 2016-04-12 DIAGNOSIS — J309 Allergic rhinitis, unspecified: Secondary | ICD-10-CM | POA: Diagnosis not present

## 2016-04-20 ENCOUNTER — Ambulatory Visit (INDEPENDENT_AMBULATORY_CARE_PROVIDER_SITE_OTHER): Payer: Medicare Other

## 2016-04-20 DIAGNOSIS — J309 Allergic rhinitis, unspecified: Secondary | ICD-10-CM | POA: Diagnosis not present

## 2016-04-26 ENCOUNTER — Ambulatory Visit (INDEPENDENT_AMBULATORY_CARE_PROVIDER_SITE_OTHER): Payer: Medicare Other

## 2016-04-26 DIAGNOSIS — J309 Allergic rhinitis, unspecified: Secondary | ICD-10-CM

## 2016-05-01 ENCOUNTER — Encounter: Payer: Self-pay | Admitting: Interventional Cardiology

## 2016-05-03 ENCOUNTER — Ambulatory Visit (INDEPENDENT_AMBULATORY_CARE_PROVIDER_SITE_OTHER): Payer: Medicare Other

## 2016-05-03 DIAGNOSIS — J309 Allergic rhinitis, unspecified: Secondary | ICD-10-CM | POA: Diagnosis not present

## 2016-05-11 ENCOUNTER — Encounter: Payer: Self-pay | Admitting: Interventional Cardiology

## 2016-06-11 ENCOUNTER — Ambulatory Visit (INDEPENDENT_AMBULATORY_CARE_PROVIDER_SITE_OTHER): Payer: Medicare Other

## 2016-06-11 DIAGNOSIS — J309 Allergic rhinitis, unspecified: Secondary | ICD-10-CM

## 2016-06-27 DIAGNOSIS — J3089 Other allergic rhinitis: Secondary | ICD-10-CM | POA: Diagnosis not present

## 2016-07-05 ENCOUNTER — Ambulatory Visit (INDEPENDENT_AMBULATORY_CARE_PROVIDER_SITE_OTHER): Payer: Medicare Other

## 2016-07-05 DIAGNOSIS — J309 Allergic rhinitis, unspecified: Secondary | ICD-10-CM

## 2016-07-06 ENCOUNTER — Other Ambulatory Visit: Payer: Self-pay | Admitting: Interventional Cardiology

## 2016-07-11 ENCOUNTER — Other Ambulatory Visit: Payer: Self-pay | Admitting: Interventional Cardiology

## 2016-07-11 ENCOUNTER — Other Ambulatory Visit: Payer: Self-pay | Admitting: *Deleted

## 2016-07-11 ENCOUNTER — Encounter: Payer: Self-pay | Admitting: Interventional Cardiology

## 2016-07-11 ENCOUNTER — Encounter: Payer: Self-pay | Admitting: *Deleted

## 2016-07-11 MED ORDER — VALSARTAN 320 MG PO TABS: 320.0000 mg | ORAL_TABLET | Freq: Every day | ORAL | 0 refills | Status: DC

## 2016-07-11 NOTE — Telephone Encounter (Signed)
valsartan (DIOVAN) 320 MG tablet  Medication  Date: 07/11/2016 Department: [facility] Office Ordering/Authorizing: Belva Crome, MD  Order Providers   Prescribing Provider Encounter Provider  Belva Crome, MD Roberts Gaudy, CMA  Medication Detail    Disp Refills Start End   valsartan (DIOVAN) 320 MG tablet 90 tablet 0 07/11/2016    Sig - Route: Take 1 tablet (320 mg total) by mouth daily. - Oral   E-Prescribing Status: Receipt confirmed by pharmacy (07/11/2016 11:57 AM EDT)   Pharmacy   [facility], [place]

## 2016-07-11 NOTE — Telephone Encounter (Signed)
Approved    Disp Refills Start End  metoprolol succinate (TOPROL-XL) 50 MG 24 hr tablet 90 tablet 0 07/09/2016   Sig:  TAKE 1 TABLET DAILY WITH ORIMMEDIATELY FOLLOWING A  MEAL  Class:  Normal  DAW:  No  Comment:  Please keep upcoming appointment for future refills. Thank you  Authorizing Provider:  Belva Crome, MD  Ordering User:  Derl Barrow  indapamide (LOZOL) 1.25 MG tablet 90 tablet 0 07/09/2016   Sig:  TAKE 1 TABLET DAILY  Class:  Normal  DAW:  No  Comment:  Please keep upcoming appointment for future refills. Thank you  Authorizing Provider:  Belva Crome, MD  Ordering User:  [place]   [facility], [place]

## 2016-07-25 ENCOUNTER — Ambulatory Visit (INDEPENDENT_AMBULATORY_CARE_PROVIDER_SITE_OTHER): Payer: Medicare Other | Admitting: Interventional Cardiology

## 2016-07-25 ENCOUNTER — Encounter: Payer: Self-pay | Admitting: Interventional Cardiology

## 2016-07-25 VITALS — BP 140/74 | HR 56 | Ht 67.0 in | Wt 170.6 lb

## 2016-07-25 DIAGNOSIS — E118 Type 2 diabetes mellitus with unspecified complications: Secondary | ICD-10-CM

## 2016-07-25 DIAGNOSIS — I25810 Atherosclerosis of coronary artery bypass graft(s) without angina pectoris: Secondary | ICD-10-CM | POA: Diagnosis not present

## 2016-07-25 DIAGNOSIS — I10 Essential (primary) hypertension: Secondary | ICD-10-CM | POA: Diagnosis not present

## 2016-07-25 DIAGNOSIS — E785 Hyperlipidemia, unspecified: Secondary | ICD-10-CM

## 2016-07-25 NOTE — Progress Notes (Signed)
Cardiology Office Note    Date:  07/25/2016   ID:  [name], [name] September 17, 1941, MRN [id number]  PCP:  Irven Shelling, MD  Cardiologist: Sinclair Grooms, MD   Chief Complaint  Patient presents with  . Coronary Artery Disease    History of Present Illness:  [name] [name] is a 75 y.o. male who presents for CABG 2008 , hypertension, DM2, and hyperlipidemia.  [name] [name] is doing well. He denies chest discomfort. No palpitations or syncope. He exercises regularly. He has had no transient neurological complaints. No leg discomfort with activity.   Past Medical History:  Diagnosis Date  . Allergic rhinitis    Immunotherapy, [place]  . BPH (benign prostatic hyperplasia)   . CAD (coronary artery disease)    CABG in 2008 with LIMA to LAD, SVG to Diag., SVG to OM1 and 2, SVG to PDA and PLOM, angioplasty x2 in 1990s, Dr. Tamala Julian.  . Constipation   . Coronary atherosclerosis of native coronary artery   . Diabetes mellitus type 2, uncontrolled ([place]) 11/2012  . ED (erectile dysfunction)   . Essential hypertension, benign    Good control  . Hemorrhoids   . Hyperlipidemia    Good control  . IBS (irritable bowel syndrome)    Hx of, with constipation    Past Surgical History:  Procedure Laterality Date  . CORONARY ANGIOPLASTY WITH STENT PLACEMENT  1990s   Angioplasty x2 in 1990s, Dr. Tamala Julian.  . CORONARY ARTERY BYPASS GRAFT  2008   With LIMA to LAD, SVG to Diag., SVG to OM1 and 2, SVG to PDA and PLOM, angioplasty x2 in 1990s, Dr. Tamala Julian.  . TONSILLECTOMY     teenager    Current Medications: Outpatient Medications Prior to Visit  Medication Sig Dispense Refill  . albuterol (PROVENTIL HFA;VENTOLIN HFA) 108 (90 BASE) MCG/ACT inhaler Inhale 2 puffs into the lungs every 4 (four) hours as needed for wheezing or shortness of breath. 1 Inhaler 1  . aspirin 81 MG tablet Take 81 mg by mouth daily.     . Azelastine-Fluticasone (DYMISTA) 137-50 MCG/ACT SUSP Place 1 spray into the nose 2 (two)  times daily.    . beclomethasone (QVAR) 80 MCG/ACT inhaler Inhale 2 puffs into the lungs 2 (two) times daily. To prevent cough or wheeze. Rinse, gargle, and spit out after use 1 Inhaler 3  . Cholecalciferol (VITAMIN D) 400 UNITS capsule Take 1,000 capsules by mouth daily.    . clobetasol (TEMOVATE) 0.05 % external solution Apply 1 application topically daily as needed (DANRUFF).     Marland Kitchen EPINEPHrine (EPIPEN 2-PAK) 0.3 mg/0.3 mL IJ SOAJ injection Inject 0.3 mg into the muscle once.    . fluticasone (FLONASE) 50 MCG/ACT nasal spray Place 1 spray into both nostrils daily.    Marland Kitchen glucose blood test strip 1 each by Other route as needed for other (VERIGOLD). Use as instructed    . indapamide (LOZOL) 1.25 MG tablet TAKE 1 TABLET DAILY 90 tablet 0  . Lancets MISC by Does not apply route.    . metoprolol succinate (TOPROL-XL) 50 MG 24 hr tablet TAKE 1 TABLET DAILY WITH ORIMMEDIATELY FOLLOWING A    MEAL 90 tablet 0  . Multiple Vitamins-Minerals (MULTIVITAMIN PO) Take 1 tablet by mouth daily.     . nitroGLYCERIN (NITROSTAT) 0.4 MG SL tablet Place 0.4 mg under the tongue every 5 (five) minutes as needed for chest pain.    . Omega-3 Fatty Acids (FISH OIL) 1200 MG CAPS Take 1,200  mg by mouth daily.    Marland Kitchen OVER THE COUNTER MEDICATION Take 1 tablet by mouth 2 (two) times daily. (SUPER BETA PROSTATE)    . pravastatin (PRAVACHOL) 40 MG tablet TAKE 1 TABLET BY MOUTH EVERY DAY 90 tablet 3  . valsartan (DIOVAN) 320 MG tablet Take 1 tablet (320 mg total) by mouth daily. 90 tablet 0  . vardenafil (LEVITRA) 20 MG tablet Take 20 mg by mouth daily as needed for erectile dysfunction.     No facility-administered medications prior to visit.      Allergies:   Sulfa antibiotics and Sulfur   Social History   Social History  . Marital status: Married    Spouse name: N/A  . Number of children: N/A  . Years of education: N/A   Social History Main Topics  . Smoking status: Former Smoker    Types: Cigarettes    Quit date:  10/07/1967  . Smokeless tobacco: Never Used  . Alcohol use Yes     Comment: wine with evening meal occasionally  . Drug use: Unknown  . Sexual activity: Not Asked   Other Topics Concern  . None   Social History Narrative  . None     Family History:  The patient's family history includes Asthma in his mother; CAD in his father; Colon cancer in his maternal aunt; Heart attack in his father.   ROS:   Please see the history of present illness.    Difficulty maintaining his blood sugars under control although his hemoglobin A1c levels have been less than 7.  All other systems reviewed and are negative.   PHYSICAL EXAM:   VS:  BP 140/74   Pulse (!) 56   Ht 5\' 7"  (1.702 m)   Wt 170 lb 9.6 oz (77.4 kg)   BMI 26.72 kg/m    GEN: Well nourished, well developed, in no acute distress  HEENT: normal  Neck: no JVD, carotid bruits, or masses Cardiac: RRR; no murmurs, rubs, or gallops,no edema  Respiratory:  clear to auscultation bilaterally, normal work of breathing GI: soft, nontender, nondistended, + BS MS: no deformity or atrophy  Skin: warm and dry, no rash Neuro:  Alert and Oriented x 3, Strength and sensation are intact Psych: euthymic mood, full affect  Wt Readings from Last 3 Encounters:  07/25/16 170 lb 9.6 oz (77.4 kg)  06/07/15 167 lb 12.8 oz (76.1 kg)  05/14/14 166 lb (75.3 kg)      Studies/Labs Reviewed:   EKG:  EKG  Sinus bradycardia with nonspecific nonspecific T wave flattening. No change when compared to prior. Prominent voltage. Mild first-degree AV block.  Recent Labs: 09/28/2015: ALT 26   Lipid Panel    Component Value Date/Time   CHOL 138 09/28/2015 0936   TRIG 96 09/28/2015 0936   HDL 46 09/28/2015 0936   CHOLHDL 3.0 09/28/2015 0936   VLDL 19 09/28/2015 0936   LDLCALC 73 09/28/2015 0936    Additional studies/ records that were reviewed today include:  No recent imaging studies    ASSESSMENT:    1. Coronary artery disease involving coronary  bypass graft of native heart without angina pectoris   2. Essential hypertension   3. Type 2 diabetes mellitus with complication, without long-term current use of insulin ([place])   4. Hyperlipidemia      PLAN:  In order of problems listed above:  1. Myocardial stress perfusion study to exclude bypass graft occlusion now approximately 10 years out from bypass surgery. 2. Low-salt  diet is discussed and it is recommended that the target blood pressure be less than or equal to 130/85 mmHg. Continue exercise. 3. A1c target 6.5 or less. Continue exercise. 4. Lipid panel will be done along with a comprehensive metabolic panel in 1-2 months. LDL target less than or equal to 70.    Medication Adjustments/Labs and Tests Ordered: Current medicines are reviewed at length with the patient today.  Concerns regarding medicines are outlined above.  Medication changes, Labs and Tests ordered today are listed in the Patient Instructions below. There are no Patient Instructions on file for this visit.   Signed, Sinclair Grooms, MD  07/25/2016 9:46 AM    [facility] [address], [place], [place]  [address] Phone: [phone]; Fax: [phone]

## 2016-07-25 NOTE — Patient Instructions (Signed)
Medication Instructions:  Your physician recommends that you continue on your current medications as directed. Please refer to the Current Medication list given to you today.   Labwork: Your physician recommends that you return for a FASTING lipid profile and cmet the same day as your stress test   Testing/Procedures: Your physician has requested that you have en exercise stress myoview. For further information please visit [email or web address]. Please follow instruction sheet, as given.    Follow-Up: Your physician wants you to follow-up in: 1 year with Dr.Smith You will receive a reminder letter in the mail two months in advance. If you don't receive a letter, please call our office to schedule the follow-up appointment.   Any Other Special Instructions Will Be Listed Below (If Applicable).     If you need a refill on your cardiac medications before your next appointment, please call your pharmacy.

## 2016-07-26 ENCOUNTER — Encounter: Payer: Self-pay | Admitting: Interventional Cardiology

## 2016-07-26 ENCOUNTER — Telehealth (HOSPITAL_COMMUNITY): Payer: Self-pay | Admitting: *Deleted

## 2016-07-26 NOTE — Telephone Encounter (Signed)
Patient given detailed instructions per Myocardial Perfusion Study Information Sheet for the test on 07/31/16 Patient notified to arrive 15 minutes early and that it is imperative to arrive on time for appointment to keep from having the test rescheduled.  If you need to cancel or reschedule your appointment, please call the office within 24 hours of your appointment. Failure to do so may result in a cancellation of your appointment, and a $50 no show fee. Patient verbalized understanding. Kirstie Peri

## 2016-07-31 ENCOUNTER — Ambulatory Visit (INDEPENDENT_AMBULATORY_CARE_PROVIDER_SITE_OTHER): Payer: Medicare Other

## 2016-07-31 ENCOUNTER — Ambulatory Visit (HOSPITAL_COMMUNITY): Payer: Medicare Other | Attending: Cardiovascular Disease

## 2016-07-31 ENCOUNTER — Other Ambulatory Visit: Payer: Medicare Other | Admitting: *Deleted

## 2016-07-31 DIAGNOSIS — E119 Type 2 diabetes mellitus without complications: Secondary | ICD-10-CM | POA: Diagnosis not present

## 2016-07-31 DIAGNOSIS — J309 Allergic rhinitis, unspecified: Secondary | ICD-10-CM | POA: Diagnosis not present

## 2016-07-31 DIAGNOSIS — I25810 Atherosclerosis of coronary artery bypass graft(s) without angina pectoris: Secondary | ICD-10-CM | POA: Diagnosis not present

## 2016-07-31 DIAGNOSIS — R9439 Abnormal result of other cardiovascular function study: Secondary | ICD-10-CM | POA: Insufficient documentation

## 2016-07-31 DIAGNOSIS — E785 Hyperlipidemia, unspecified: Secondary | ICD-10-CM

## 2016-07-31 DIAGNOSIS — I10 Essential (primary) hypertension: Secondary | ICD-10-CM | POA: Diagnosis not present

## 2016-07-31 LAB — MYOCARDIAL PERFUSION IMAGING
CHL CUP MPHR: 147 {beats}/min
CHL CUP NUCLEAR SDS: 4
CHL CUP NUCLEAR SRS: 7
CHL CUP NUCLEAR SSS: 11
CHL CUP RESTING HR STRESS: 53 {beats}/min
CSEPED: 10 min
CSEPEW: 12.5 METS
CSEPHR: 100 %
Exercise duration (sec): 30 s
LHR: 0.28
LV sys vol: 45 mL
LVDIAVOL: 95 mL (ref 62–150)
Peak HR: 147 {beats}/min
TID: 1.09

## 2016-07-31 LAB — COMPREHENSIVE METABOLIC PANEL
ALBUMIN: 4.5 g/dL (ref 3.6–5.1)
ALK PHOS: 34 U/L — AB (ref 40–115)
ALT: 30 U/L (ref 9–46)
AST: 37 U/L — ABNORMAL HIGH (ref 10–35)
BUN: 18 mg/dL (ref 7–25)
CALCIUM: 9.6 mg/dL (ref 8.6–10.3)
CHLORIDE: 98 mmol/L (ref 98–110)
CO2: 26 mmol/L (ref 20–31)
Creat: 0.93 mg/dL (ref 0.70–1.18)
Glucose, Bld: 100 mg/dL — ABNORMAL HIGH (ref 65–99)
POTASSIUM: 4 mmol/L (ref 3.5–5.3)
Sodium: 138 mmol/L (ref 135–146)
TOTAL PROTEIN: 7.4 g/dL (ref 6.1–8.1)
Total Bilirubin: 1.3 mg/dL — ABNORMAL HIGH (ref 0.2–1.2)

## 2016-07-31 LAB — LIPID PANEL
CHOLESTEROL: 154 mg/dL (ref 125–200)
HDL: 46 mg/dL (ref >=40)
LDL CALC: 84 mg/dL (ref <130)
TRIGLYCERIDES: 118 mg/dL (ref <150)
Total CHOL/HDL Ratio: 3.3 Ratio (ref <=5.0)
VLDL: 24 mg/dL (ref <30)

## 2016-07-31 MED ORDER — TECHNETIUM TC 99M TETROFOSMIN IV KIT
11.0000 | PACK | Freq: Once | INTRAVENOUS | Status: AC | PRN
  Administered 2016-07-31: 11 via INTRAVENOUS
  Filled 2016-07-31: qty 11

## 2016-07-31 MED ORDER — TECHNETIUM TC 99M TETROFOSMIN IV KIT
30.0000 | PACK | Freq: Once | INTRAVENOUS | Status: AC | PRN
  Administered 2016-07-31: 30 via INTRAVENOUS
  Filled 2016-07-31: qty 30

## 2016-08-10 DIAGNOSIS — N400 Enlarged prostate without lower urinary tract symptoms: Secondary | ICD-10-CM | POA: Diagnosis not present

## 2016-08-10 DIAGNOSIS — I10 Essential (primary) hypertension: Secondary | ICD-10-CM | POA: Diagnosis not present

## 2016-08-10 DIAGNOSIS — Z23 Encounter for immunization: Secondary | ICD-10-CM | POA: Diagnosis not present

## 2016-08-10 DIAGNOSIS — E119 Type 2 diabetes mellitus without complications: Secondary | ICD-10-CM | POA: Diagnosis not present

## 2016-08-15 DIAGNOSIS — H524 Presbyopia: Secondary | ICD-10-CM | POA: Diagnosis not present

## 2016-08-15 DIAGNOSIS — E119 Type 2 diabetes mellitus without complications: Secondary | ICD-10-CM | POA: Diagnosis not present

## 2016-08-15 DIAGNOSIS — H25813 Combined forms of age-related cataract, bilateral: Secondary | ICD-10-CM | POA: Diagnosis not present

## 2016-08-28 ENCOUNTER — Ambulatory Visit (INDEPENDENT_AMBULATORY_CARE_PROVIDER_SITE_OTHER): Payer: Medicare Other

## 2016-08-28 DIAGNOSIS — J309 Allergic rhinitis, unspecified: Secondary | ICD-10-CM | POA: Diagnosis not present

## 2016-09-12 ENCOUNTER — Encounter: Payer: Self-pay | Admitting: Interventional Cardiology

## 2016-09-18 ENCOUNTER — Telehealth: Payer: Self-pay | Admitting: Interventional Cardiology

## 2016-09-18 NOTE — Telephone Encounter (Signed)
Spoke with pt and advised that medical records is sending a copy of the stress test now.  Pt not sure if [facility] needs a letter as well.  Advised pt I would call [facility] to see.  Pt appreciative for all assistance.  Left message for Shearon Balo, nurse at [facility], to call back if they needed anything other than the stress test from [place].

## 2016-09-18 NOTE — Telephone Encounter (Signed)
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  He will fax it to [phone], pt is asking that rn call him when she has it in hand

## 2016-09-25 DIAGNOSIS — L218 Other seborrheic dermatitis: Secondary | ICD-10-CM | POA: Diagnosis not present

## 2016-09-26 ENCOUNTER — Other Ambulatory Visit: Payer: Medicare Other | Admitting: *Deleted

## 2016-09-26 ENCOUNTER — Ambulatory Visit (INDEPENDENT_AMBULATORY_CARE_PROVIDER_SITE_OTHER): Payer: Medicare Other

## 2016-09-26 DIAGNOSIS — E785 Hyperlipidemia, unspecified: Secondary | ICD-10-CM

## 2016-09-26 DIAGNOSIS — J309 Allergic rhinitis, unspecified: Secondary | ICD-10-CM | POA: Diagnosis not present

## 2016-09-26 LAB — HEPATIC FUNCTION PANEL
ALK PHOS: 35 U/L — AB (ref 40–115)
ALT: 26 U/L (ref 9–46)
AST: 26 U/L (ref 10–35)
Albumin: 4.3 g/dL (ref 3.6–5.1)
BILIRUBIN DIRECT: 0.2 mg/dL (ref <=0.2)
BILIRUBIN INDIRECT: 0.5 mg/dL (ref 0.2–1.2)
TOTAL PROTEIN: 7.1 g/dL (ref 6.1–8.1)
Total Bilirubin: 0.7 mg/dL (ref 0.2–1.2)

## 2016-09-26 LAB — LIPID PANEL
CHOLESTEROL: 122 mg/dL (ref <200)
HDL: 45 mg/dL (ref >40)
LDL CALC: 54 mg/dL (ref <100)
TRIGLYCERIDES: 114 mg/dL (ref <150)
Total CHOL/HDL Ratio: 2.7 Ratio (ref <5.0)
VLDL: 23 mg/dL (ref <30)

## 2016-09-26 NOTE — Addendum Note (Signed)
Addended by: Eulis Foster on: 09/26/2016 10:15 AM   Modules accepted: Orders

## 2016-09-27 ENCOUNTER — Ambulatory Visit (INDEPENDENT_AMBULATORY_CARE_PROVIDER_SITE_OTHER): Payer: Medicare Other | Admitting: Allergy

## 2016-09-27 ENCOUNTER — Encounter: Payer: Self-pay | Admitting: Allergy

## 2016-09-27 VITALS — BP 130/80 | HR 61 | Temp 98.5°F | Resp 16 | Ht 66.5 in | Wt 176.0 lb

## 2016-09-27 DIAGNOSIS — I25810 Atherosclerosis of coronary artery bypass graft(s) without angina pectoris: Secondary | ICD-10-CM | POA: Diagnosis not present

## 2016-09-27 DIAGNOSIS — J309 Allergic rhinitis, unspecified: Secondary | ICD-10-CM

## 2016-09-27 DIAGNOSIS — J4530 Mild persistent asthma, uncomplicated: Secondary | ICD-10-CM | POA: Insufficient documentation

## 2016-09-27 DIAGNOSIS — H1010 Acute atopic conjunctivitis, unspecified eye: Secondary | ICD-10-CM | POA: Diagnosis not present

## 2016-09-27 MED ORDER — AZELASTINE-FLUTICASONE 137-50 MCG/ACT NA SUSP: 1.0000 | Freq: Two times a day (BID) | NASAL | 5 refills | Status: DC

## 2016-09-27 NOTE — Progress Notes (Signed)
Follow-up Note  RE: [name] [name] MRN: [id number] DOB: 01-23-1941 Date of Office Visit: 09/27/2016   History of present illness: [name] [name] is a 75 y.o. male presenting today for follow-up of asthma and allergic rhinoconjunctivitis.  He returns today for medication refills.   He was last seen in our office on 08/12/15 by Dr. Ishmael Holter.   Since his last visit reports he has done very well without any major illnesses, surgeries or hospitalizations.  He continues to use Dymista 1 spray in each nostril daily which he reports is very helpful in managing his nasal symptoms.   Will use claritin as needed.  He also is on allergen immunotherapy that he receives now monthly. He denies any large local or systemic reactions.  He reports his asthma has done very well. He has Qvar but has not needed to use it at all this year or his albuterol.  He also has not been taking his Singulair but does state when he was using it that it was helpful. He has not had any cough or wheeze or shortness of breath and denies any nighttime awakenings. He has not required any oral steroids or ED or urgent care visits       Review of systems: Review of Systems  Constitutional: Negative for chills, fever and malaise/fatigue.  HENT: Negative for congestion, sinus pain and sore throat.   Eyes: Negative for discharge and redness.  Respiratory: Negative for cough, shortness of breath and wheezing.   Cardiovascular: Negative for chest pain.  Gastrointestinal: Negative for heartburn, nausea and vomiting.  Skin: Negative for itching and rash.    All other systems negative unless noted above in HPI  Past medical/social/surgical/family history have been reviewed and are unchanged unless specifically indicated below.  No changes  Medication List:   Medication List       Accurate as of 09/27/16 11:48 AM. Always use your most recent med list.          albuterol 108 (90 Base) MCG/ACT inhaler Commonly known as:   PROVENTIL HFA;VENTOLIN HFA Inhale 2 puffs into the lungs every 4 (four) hours as needed for wheezing or shortness of breath.   aspirin 81 MG tablet Take 81 mg by mouth daily.   beclomethasone 80 MCG/ACT inhaler Commonly known as:  QVAR Inhale 2 puffs into the lungs 2 (two) times daily. To prevent cough or wheeze. Rinse, gargle, and spit out after use   clobetasol 0.05 % external solution Commonly known as:  TEMOVATE Apply 1 application topically daily as needed (DANRUFF).   DYMISTA 137-50 MCG/ACT Susp Generic drug:  Azelastine-Fluticasone Place 1 spray into the nose 2 (two) times daily.   EPIPEN 2-PAK 0.3 mg/0.3 mL Soaj injection Generic drug:  EPINEPHrine Inject 0.3 mg into the muscle once.   glucose blood test strip 1 each by Other route as needed for other (VERIGOLD). Use as instructed   indapamide 1.25 MG tablet Commonly known as:  LOZOL TAKE 1 TABLET DAILY   KLOR-CON M10 10 MEQ tablet Generic drug:  potassium chloride Take 10 mEq by mouth daily.   Lancets Misc by Does not apply route.   metoprolol succinate 50 MG 24 hr tablet Commonly known as:  TOPROL-XL TAKE 1 TABLET DAILY WITH ORIMMEDIATELY FOLLOWING A    MEAL   MULTIVITAMIN PO Take 1 tablet by mouth daily.   nitroGLYCERIN 0.4 MG SL tablet Commonly known as:  NITROSTAT Place 0.4 mg under the tongue every 5 (five) minutes as needed  for chest pain.   OVER THE COUNTER MEDICATION Take 1 tablet by mouth 2 (two) times daily. (SUPER BETA PROSTATE)   pravastatin 40 MG tablet Commonly known as:  PRAVACHOL TAKE 1 TABLET BY MOUTH EVERY DAY   valsartan 320 MG tablet Commonly known as:  DIOVAN Take 1 tablet (320 mg total) by mouth daily.   vardenafil 20 MG tablet Commonly known as:  LEVITRA Take 20 mg by mouth daily as needed for erectile dysfunction.       Known medication allergies: Allergies  Allergen Reactions  . Sulfa Antibiotics Rash       . Sulfur Rash     Physical examination: Blood  pressure 130/80, pulse 61, temperature 98.5 F (36.9 C), temperature source Oral, resp. rate 16, height 5' 6.5" (1.689 m), weight 176 lb (79.8 kg), SpO2 97 %.  General: Alert, interactive, in no acute distress. HEENT: TMs pearly gray, turbinates minimally edematous without discharge, post-pharynx non erythematous. Neck: Supple without lymphadenopathy. Lungs: Clear to auscultation without wheezing, rhonchi or rales. {no increased work of breathing. CV: Normal S1, S2 without murmurs. Abdomen: Nondistended, nontender. Skin: Warm and dry, without lesions or rashes. Extremities:  No clubbing, cyanosis or edema. Neuro:   Grossly intact.  Diagnositics/Labs:  Spirometry: FEV1: 2.45L  107%, FVC: 3.23L  111%, ratio consistent with Nonobstructive pattern  ACT score 24  Assessment and plan:  Mild persistent asthma - Well-controlled - Continue albuterol 2 puffs every 4-6 hours as needed for cough, wheeze, shortness of breath - Restart Qvar 80 g 2 puffs twice a day during respiratory illness/asthma exacerbation - Restart Singulair 10 mg daily if you have to start back on Qvar Asthma control goals:   Full participation in all desired activities (may need albuterol before activity)  Albuterol use two time or less a week on average (not counting use with activity)  Cough interfering with sleep two time or less a month  Oral steroids no more than once a year  No hospitalizations  Allergic rhinoconjunctivitis - Avoidance: Dust Mite and Mold.  - Claritin 10mg  by mouth once daily for runny nose or itching as needed. - Continue Dymista 1-2 spray(s) each nostril daily for stuffy nose or drainage. --Refill today - Continue monthly allergen immunotherapy        -Epi-pen/Benadryl as needed.   Follow up Visit: 6 months or sooner if needed.      I appreciate the opportunity to take part in Jonerik's care. Please do not hesitate to contact me with questions.  Sincerely,   Prudy Feeler,  MD Allergy/Immunology [facility]

## 2016-09-27 NOTE — Patient Instructions (Signed)
Take Home Sheet  1. Avoidance: Dust Mite and Mold.   2. Antihistamine: Claritin 10mg  by mouth once daily for runny nose or itching as needed.  3. Nasal Spray: Dymista 1-2 spray(s) each nostril daily for stuffy nose or drainage.   4. Inhalers: Rescue: ProAir 2 puffs every 4 hours as needed for cough or wheeze.       -May use 2 puffs 10-20 minutes prior to exercise.  Preventative: Use when you have a respiratory illness.  QVAR 21mcg 2 puffs twice daily (Rinse, gargle, and spit out after use)   5. Resume Singulair 10mg  each evening if you need to resume your inhalers as above  6. Nasal Saline wash each evening at bath/shower time as needed.  7. Continue monthly allergy shots  8.  Epi-pen/Benadryl as needed.    9.  Follow up Visit: 6 months or sooner if needed.     Websites that have reliable Patient information: 1. American Academy of Asthma, Allergy, & Immunology: www.aaaai.org 2. Food Allergy Network: www.foodallergy.org 3. Mothers of Asthmatics: www.aanma.org 4. [place]: DiningCalendar.de 5. American College of Allergy, Asthma, & Immunology: https://robertson.info/ or www.acaai.org

## 2016-10-18 ENCOUNTER — Other Ambulatory Visit: Payer: Self-pay | Admitting: Interventional Cardiology

## 2016-10-18 ENCOUNTER — Other Ambulatory Visit: Payer: Self-pay | Admitting: *Deleted

## 2016-10-18 MED ORDER — VALSARTAN 320 MG PO TABS: 320.0000 mg | ORAL_TABLET | Freq: Every day | ORAL | 2 refills | Status: DC

## 2016-10-18 MED ORDER — METOPROLOL SUCCINATE ER 50 MG PO TB24: ORAL_TABLET | ORAL | 2 refills | Status: DC

## 2016-10-18 MED ORDER — INDAPAMIDE 1.25 MG PO TABS: 1.2500 mg | ORAL_TABLET | Freq: Every day | ORAL | 2 refills | Status: DC

## 2016-10-23 ENCOUNTER — Encounter: Payer: Self-pay | Admitting: Interventional Cardiology

## 2016-10-25 ENCOUNTER — Ambulatory Visit (INDEPENDENT_AMBULATORY_CARE_PROVIDER_SITE_OTHER): Payer: Medicare Other

## 2016-10-25 DIAGNOSIS — J4530 Mild persistent asthma, uncomplicated: Secondary | ICD-10-CM

## 2016-11-20 NOTE — Addendum Note (Signed)
Addended by: Felipa Emory on: 11/20/2016 10:07 AM   Modules accepted: Orders

## 2016-11-22 ENCOUNTER — Ambulatory Visit (INDEPENDENT_AMBULATORY_CARE_PROVIDER_SITE_OTHER): Payer: Medicare Other | Admitting: *Deleted

## 2016-11-22 DIAGNOSIS — J309 Allergic rhinitis, unspecified: Secondary | ICD-10-CM | POA: Diagnosis not present

## 2016-12-25 ENCOUNTER — Ambulatory Visit (INDEPENDENT_AMBULATORY_CARE_PROVIDER_SITE_OTHER): Payer: Medicare Other | Admitting: *Deleted

## 2016-12-25 DIAGNOSIS — J309 Allergic rhinitis, unspecified: Secondary | ICD-10-CM

## 2017-01-13 DIAGNOSIS — J069 Acute upper respiratory infection, unspecified: Secondary | ICD-10-CM | POA: Diagnosis not present

## 2017-01-13 DIAGNOSIS — B9789 Other viral agents as the cause of diseases classified elsewhere: Secondary | ICD-10-CM | POA: Diagnosis not present

## 2017-01-18 ENCOUNTER — Ambulatory Visit (INDEPENDENT_AMBULATORY_CARE_PROVIDER_SITE_OTHER): Payer: Medicare Other | Admitting: *Deleted

## 2017-01-18 DIAGNOSIS — J309 Allergic rhinitis, unspecified: Secondary | ICD-10-CM

## 2017-02-13 DIAGNOSIS — H25813 Combined forms of age-related cataract, bilateral: Secondary | ICD-10-CM | POA: Diagnosis not present

## 2017-02-13 DIAGNOSIS — E119 Type 2 diabetes mellitus without complications: Secondary | ICD-10-CM | POA: Diagnosis not present

## 2017-02-13 DIAGNOSIS — H524 Presbyopia: Secondary | ICD-10-CM | POA: Diagnosis not present

## 2017-02-19 ENCOUNTER — Ambulatory Visit (INDEPENDENT_AMBULATORY_CARE_PROVIDER_SITE_OTHER): Payer: Medicare Other | Admitting: *Deleted

## 2017-02-19 DIAGNOSIS — J309 Allergic rhinitis, unspecified: Secondary | ICD-10-CM | POA: Diagnosis not present

## 2017-02-21 DIAGNOSIS — E119 Type 2 diabetes mellitus without complications: Secondary | ICD-10-CM | POA: Diagnosis not present

## 2017-02-21 DIAGNOSIS — Z7189 Other specified counseling: Secondary | ICD-10-CM | POA: Diagnosis not present

## 2017-02-21 DIAGNOSIS — Z1389 Encounter for screening for other disorder: Secondary | ICD-10-CM | POA: Diagnosis not present

## 2017-02-21 DIAGNOSIS — N400 Enlarged prostate without lower urinary tract symptoms: Secondary | ICD-10-CM | POA: Diagnosis not present

## 2017-02-21 DIAGNOSIS — I10 Essential (primary) hypertension: Secondary | ICD-10-CM | POA: Diagnosis not present

## 2017-02-21 DIAGNOSIS — Z0000 Encounter for general adult medical examination without abnormal findings: Secondary | ICD-10-CM | POA: Diagnosis not present

## 2017-02-21 DIAGNOSIS — E7800 Pure hypercholesterolemia, unspecified: Secondary | ICD-10-CM | POA: Diagnosis not present

## 2017-03-13 ENCOUNTER — Encounter: Payer: Self-pay | Admitting: *Deleted

## 2017-03-13 NOTE — Progress Notes (Signed)
Maintenance vial made

## 2017-03-15 DIAGNOSIS — J3089 Other allergic rhinitis: Secondary | ICD-10-CM | POA: Diagnosis not present

## 2017-03-19 ENCOUNTER — Ambulatory Visit (INDEPENDENT_AMBULATORY_CARE_PROVIDER_SITE_OTHER): Payer: Medicare Other | Admitting: *Deleted

## 2017-03-19 DIAGNOSIS — J309 Allergic rhinitis, unspecified: Secondary | ICD-10-CM

## 2017-04-22 ENCOUNTER — Ambulatory Visit (INDEPENDENT_AMBULATORY_CARE_PROVIDER_SITE_OTHER): Payer: Medicare Other | Admitting: *Deleted

## 2017-04-22 DIAGNOSIS — J309 Allergic rhinitis, unspecified: Secondary | ICD-10-CM

## 2017-05-20 ENCOUNTER — Ambulatory Visit (INDEPENDENT_AMBULATORY_CARE_PROVIDER_SITE_OTHER): Payer: Medicare Other

## 2017-05-20 DIAGNOSIS — J309 Allergic rhinitis, unspecified: Secondary | ICD-10-CM

## 2017-06-21 ENCOUNTER — Ambulatory Visit (INDEPENDENT_AMBULATORY_CARE_PROVIDER_SITE_OTHER): Payer: Medicare Other

## 2017-06-21 ENCOUNTER — Telehealth: Payer: Self-pay | Admitting: Pharmacist

## 2017-06-21 DIAGNOSIS — J309 Allergic rhinitis, unspecified: Secondary | ICD-10-CM | POA: Diagnosis not present

## 2017-06-21 MED ORDER — IRBESARTAN 300 MG PO TABS: 300.0000 mg | ORAL_TABLET | Freq: Every day | ORAL | 3 refills | Status: DC

## 2017-06-21 NOTE — Telephone Encounter (Signed)
Received notice from [facility] that pt's valsartan was affected by recall. Will switch valsartan 320mg  to equivalent dose of irbesartan 300mg  daily. Pt is aware of change in therapy and has been advised to monitor his BP over the next few weeks.

## 2017-07-02 ENCOUNTER — Ambulatory Visit (INDEPENDENT_AMBULATORY_CARE_PROVIDER_SITE_OTHER): Payer: Medicare Other

## 2017-07-02 DIAGNOSIS — J309 Allergic rhinitis, unspecified: Secondary | ICD-10-CM

## 2017-07-09 ENCOUNTER — Ambulatory Visit (INDEPENDENT_AMBULATORY_CARE_PROVIDER_SITE_OTHER): Payer: Medicare Other | Admitting: *Deleted

## 2017-07-09 DIAGNOSIS — J309 Allergic rhinitis, unspecified: Secondary | ICD-10-CM | POA: Diagnosis not present

## 2017-07-11 ENCOUNTER — Ambulatory Visit (INDEPENDENT_AMBULATORY_CARE_PROVIDER_SITE_OTHER): Payer: Medicare Other | Admitting: Allergy

## 2017-07-11 ENCOUNTER — Encounter: Payer: Self-pay | Admitting: Allergy

## 2017-07-11 VITALS — BP 110/70 | HR 58 | Temp 98.3°F | Resp 16

## 2017-07-11 DIAGNOSIS — J309 Allergic rhinitis, unspecified: Secondary | ICD-10-CM

## 2017-07-11 DIAGNOSIS — H1010 Acute atopic conjunctivitis, unspecified eye: Secondary | ICD-10-CM | POA: Diagnosis not present

## 2017-07-11 DIAGNOSIS — J4530 Mild persistent asthma, uncomplicated: Secondary | ICD-10-CM

## 2017-07-11 NOTE — Patient Instructions (Addendum)
Take Home Sheet  1. Avoidance: [place], Dust Mite and Mold.   2. Antihistamine: Claritin 10mg  by mouth once daily for runny nose or itching as needed.  3. Nasal Spray: Dymista 1-2 spray(s) each nostril daily for stuffy nose or drainage.   4. Inhalers: Rescue: ProAir 2 puffs every 4 hours as needed for cough or wheeze.       -May use 2 puffs 10-20 minutes prior to exercise.  Preventative: Use when you have a respiratory illness.  QVAR 34mcg 2 puffs twice daily (Rinse, gargle, and spit out after use)   5. Hold on to your Singulair.  At this time both asthma and allergies are well controlled and you do not need this medication.  If symptoms return in the fall/winter would have you restart this.   6. Nasal Saline wash each evening at bath/shower time as needed.  7. Continue routine allergy shots  8.  Epi-pen/Benadryl as needed.    9.  Follow up Visit: 6-9 months or sooner if needed.

## 2017-07-11 NOTE — Progress Notes (Signed)
Follow-up Note  RE: [name] [name] MRN: [id number] DOB: 20-Jul-1941 Date of Office Visit: 07/11/2017   History of present illness: [name] [name] is a 76 y.o. male presenting today for follow-up of asthma and allergic rhinoconjunctivitis.  He was last seen in the office on 09/27/16 by myself.  Since his last visit he has not had any major changes with his help, surgeries or hospitalizations. He states he has worsening of his allergy and asthma symptoms in Dec/January and since then has been doing very well.  He states he never needed ED/UC or oral steroids this winter.  He continues on Qvar 2 puffs twice a day only with illnesses which he did use this winter.  He has not needed to use Qvar since.  He denies any nighttime awakenings.  He stopped taking Singulair earlier this year as he felt he was well controlled.   He states his allergy symptoms are well controlled.  He feels that since reaching maintenance on AIT he has had improvement in his symptoms and has not needed to use any medications.  He tolerates injections well without any local or systemic reactions.    Review of systems: Review of Systems  Constitutional: Negative for chills, fever and malaise/fatigue.  HENT: Negative for congestion, ear discharge, ear pain, nosebleeds, sinus pain, sore throat and tinnitus.   Eyes: Negative for pain, discharge and redness.  Respiratory: Negative for cough, shortness of breath and wheezing.   Cardiovascular: Negative for chest pain.  Gastrointestinal: Negative for abdominal pain, constipation, diarrhea, heartburn, nausea and vomiting.  Musculoskeletal: Negative for joint pain.  Skin: Negative for itching and rash.  Neurological: Negative for headaches.    All other systems negative unless noted above in HPI  Past medical/social/surgical/family history have been reviewed and are unchanged unless specifically indicated below.  No changes  Medication List: Allergies as of 07/11/2017    Reactions   Sulfa Antibiotics Rash      Sulfur Rash      Medication List       Accurate as of 07/11/17  4:20 PM. Always use your most recent med list.          albuterol 108 (90 Base) MCG/ACT inhaler Commonly known as:  PROVENTIL HFA;VENTOLIN HFA Inhale 2 puffs into the lungs every 4 (four) hours as needed for wheezing or shortness of breath.   aspirin 81 MG tablet Take 81 mg by mouth daily.   Azelastine-Fluticasone 137-50 MCG/ACT Susp Commonly known as:  DYMISTA Place 1 spray into the nose 2 (two) times daily.   beclomethasone 80 MCG/ACT inhaler Commonly known as:  QVAR Inhale 2 puffs into the lungs 2 (two) times daily. To prevent cough or wheeze. Rinse, gargle, and spit out after use   clobetasol 0.05 % external solution Commonly known as:  TEMOVATE Apply 1 application topically daily as needed (DANRUFF).   EPIPEN 2-PAK 0.3 mg/0.3 mL Soaj injection Generic drug:  EPINEPHrine Inject 0.3 mg into the muscle once.   glucose blood test strip 1 each by Other route as needed for other (VERIGOLD). Use as instructed   indapamide 1.25 MG tablet Commonly known as:  LOZOL Take 1 tablet (1.25 mg total) by mouth daily.   irbesartan 300 MG tablet Commonly known as:  AVAPRO Take 1 tablet (300 mg total) by mouth daily.   KLOR-CON M20 20 MEQ tablet Generic drug:  potassium chloride SA   Lancets Misc by Does not apply route.   metoprolol succinate 50 MG 24  hr tablet Commonly known as:  TOPROL-XL TAKE 1 TABLET DAILY WITH ORIMMEDIATELY FOLLOWING A    MEAL   MULTIVITAMIN PO Take 1 tablet by mouth daily.   nitroGLYCERIN 0.4 MG SL tablet Commonly known as:  NITROSTAT Place 0.4 mg under the tongue every 5 (five) minutes as needed for chest pain.   OVER THE COUNTER MEDICATION Take 1 tablet by mouth 2 (two) times daily. (SUPER BETA PROSTATE)   pravastatin 40 MG tablet Commonly known as:  PRAVACHOL TAKE 1 TABLET BY MOUTH EVERY DAY   vardenafil 20 MG tablet Commonly known  as:  LEVITRA Take 20 mg by mouth daily as needed for erectile dysfunction.       Known medication allergies: Allergies  Allergen Reactions  . Sulfa Antibiotics Rash       . Sulfur Rash     Physical examination: Blood pressure 110/70, pulse (!) 58, temperature 98.3 F (36.8 C), temperature source Oral, resp. rate 16, SpO2 98 %.  General: Alert, interactive, in no acute distress. HEENT: PERRLA, TMs pearly gray, turbinates minimally edematous without discharge, post-pharynx non erythematous. Neck: Supple without lymphadenopathy. Lungs: Clear to auscultation without wheezing, rhonchi or rales. {no increased work of breathing. CV: Normal S1, S2 without murmurs. Abdomen: Nondistended, nontender. Skin: Warm and dry, without lesions or rashes. Extremities:  No clubbing, cyanosis or edema. Neuro:   Grossly intact.  Diagnositics/Labs:  Spirometry: FEV1: 2.41L  108%, FVC: 3.42L  120%, ratio consistent with nonobstructive pattern ACT 25  Assessment and plan: Mild persistent asthma - Well-controlled - Continue albuterol 2 puffs every 4-6 hours as needed for cough, wheeze, shortness of breath - Restart Qvar 80 g 2 puffs twice a day during respiratory illness/asthma exacerbation - Restart Singulair 10 mg daily if you have to start back on Qvar Asthma control goals:   Full participation in all desired activities (may need albuterol before activity)  Albuterol use two time or less a week on average (not counting use with activity)  Cough interfering with sleep two time or less a month  Oral steroids no more than once a year  No hospitalizations  Allergic rhinoconjunctivitis - Avoidance: Dust Mite and Mold.       - Claritin 10mg  by mouth once daily for runny nose or itching as needed. - Continue Dymista 1-2 spray(s) each nostril daily for stuffy nose or drainage. --Refill today - Continue monthly allergen immunotherapy        -Epi-pen/Benadryl as needed.   Follow up Visit:  6 months or sooner if needed.    I appreciate the opportunity to take part in Torri's care. Please do not hesitate to contact me with questions.  Sincerely,   Prudy Feeler, MD Allergy/Immunology [facility]

## 2017-07-13 ENCOUNTER — Other Ambulatory Visit: Payer: Self-pay | Admitting: Interventional Cardiology

## 2017-07-17 ENCOUNTER — Ambulatory Visit (INDEPENDENT_AMBULATORY_CARE_PROVIDER_SITE_OTHER): Payer: Medicare Other

## 2017-07-17 DIAGNOSIS — J309 Allergic rhinitis, unspecified: Secondary | ICD-10-CM | POA: Diagnosis not present

## 2017-07-24 ENCOUNTER — Ambulatory Visit (INDEPENDENT_AMBULATORY_CARE_PROVIDER_SITE_OTHER): Payer: Medicare Other

## 2017-07-24 DIAGNOSIS — J309 Allergic rhinitis, unspecified: Secondary | ICD-10-CM

## 2017-07-30 ENCOUNTER — Ambulatory Visit (INDEPENDENT_AMBULATORY_CARE_PROVIDER_SITE_OTHER): Payer: Medicare Other | Admitting: Interventional Cardiology

## 2017-07-30 ENCOUNTER — Encounter: Payer: Self-pay | Admitting: Interventional Cardiology

## 2017-07-30 VITALS — BP 138/76 | HR 52 | Ht 67.5 in | Wt 175.4 lb

## 2017-07-30 DIAGNOSIS — I10 Essential (primary) hypertension: Secondary | ICD-10-CM | POA: Diagnosis not present

## 2017-07-30 DIAGNOSIS — I25810 Atherosclerosis of coronary artery bypass graft(s) without angina pectoris: Secondary | ICD-10-CM | POA: Diagnosis not present

## 2017-07-30 DIAGNOSIS — E7849 Other hyperlipidemia: Secondary | ICD-10-CM

## 2017-07-30 MED ORDER — ATORVASTATIN CALCIUM 40 MG PO TABS: 40.0000 mg | ORAL_TABLET | Freq: Every day | ORAL | 3 refills | Status: DC

## 2017-07-30 NOTE — Patient Instructions (Signed)
Medication Instructions:  1) DISCONTINUE Pravastatin 2) START Atorvastatin 40mg  once daily  Labwork: Your physician recommends that you return for lab work in: 8 weeks (Lipid, liver)   Testing/Procedures: None  Follow-Up: Your physician wants you to follow-up in: 1 year with Dr. Tamala Julian.  You will receive a reminder letter in the mail two months in advance. If you don't receive a letter, please call our office to schedule the follow-up appointment.   Any Other Special Instructions Will Be Listed Below (If Applicable).     If you need a refill on your cardiac medications before your next appointment, please call your pharmacy.

## 2017-07-30 NOTE — Progress Notes (Signed)
Cardiology Office Note    Date:  07/30/2017   ID:  [name] [name], [name] [name] 24-Sep-1941, MRN [id number]  PCP:  Lavone Orn, MD  Cardiologist: [name] Grooms, MD   Chief Complaint  Patient presents with  . Coronary Artery Disease    History of Present Illness:  [name] is a 75 y.o. male who presents for CABG 2008 , hypertension, DM2, and hyperlipidemia.   Future is doing well. He is not as active as in previous years. Not walking as much as before. He denies medication side effects. He has no current side effects to his medical regimen.  Past Medical History:  Diagnosis Date  . Allergic rhinitis    Immunotherapy, Bardelas  . BPH (benign prostatic hyperplasia)   . CAD (coronary artery disease)    CABG in 2008 with LIMA to LAD, SVG to Diag., SVG to OM1 and 2, SVG to PDA and PLOM, angioplasty x2 in 1990s, Dr. Tamala Julian.  . Constipation   . Coronary atherosclerosis of native coronary artery   . Diabetes mellitus type 2, uncontrolled ([place]) 11/2012  . ED (erectile dysfunction)   . Essential hypertension, benign    Good control  . Hemorrhoids   . Hyperlipidemia    Good control  . IBS (irritable bowel syndrome)    Hx of, with constipation    Past Surgical History:  Procedure Laterality Date  . CORONARY ANGIOPLASTY WITH STENT PLACEMENT  1990s   Angioplasty x2 in 1990s, Dr. Tamala Julian.  . CORONARY ARTERY BYPASS GRAFT  2008   With LIMA to LAD, SVG to Diag., SVG to OM1 and 2, SVG to PDA and PLOM, angioplasty x2 in 1990s, Dr. Tamala Julian.  . TONSILLECTOMY     teenager    Current Medications: Outpatient Medications Prior to Visit  Medication Sig Dispense Refill  . albuterol (PROVENTIL HFA;VENTOLIN HFA) 108 (90 BASE) MCG/ACT inhaler Inhale 2 puffs into the lungs every 4 (four) hours as needed for wheezing or shortness of breath. 1 Inhaler 1  . aspirin 81 MG tablet Take 81 mg by mouth daily.     . Azelastine-Fluticasone (DYMISTA) 137-50 MCG/ACT SUSP Place 1 spray into the nose 2 (two)  times daily. 1 Bottle 5  . beclomethasone (QVAR) 80 MCG/ACT inhaler Inhale 2 puffs into the lungs 2 (two) times daily. To prevent cough or wheeze. Rinse, gargle, and spit out after use 1 Inhaler 3  . clobetasol (TEMOVATE) 0.05 % external solution Apply 1 application topically daily as needed (DANRUFF).     Marland Kitchen EPINEPHrine (EPIPEN 2-PAK) 0.3 mg/0.3 mL IJ SOAJ injection Inject 0.3 mg into the muscle once.    Marland Kitchen glucose blood test strip 1 each by Other route as needed for other (VERIGOLD). Use as instructed    . indapamide (LOZOL) 1.25 MG tablet TAKE 1 TABLET (1.25 MG TOTAL) BY MOUTH DAILY. 90 tablet 0  . irbesartan (AVAPRO) 300 MG tablet Take 1 tablet (300 mg total) by mouth daily. 90 tablet 3  . KLOR-CON M20 20 MEQ tablet     . Lancets MISC by Does not apply route.    . metoprolol succinate (TOPROL-XL) 50 MG 24 hr tablet TAKE 1 TABLET DAILY WITH ORIMMEDIATELY FOLLOWING A    MEAL 90 tablet 2  . Multiple Vitamins-Minerals (MULTIVITAMIN PO) Take 1 tablet by mouth daily.     . nitroGLYCERIN (NITROSTAT) 0.4 MG SL tablet Place 0.4 mg under the tongue every 5 (five) minutes as needed for chest pain.    Marland Kitchen OVER  THE COUNTER MEDICATION Take 1 tablet by mouth 2 (two) times daily. (SUPER BETA PROSTATE)    . pravastatin (PRAVACHOL) 40 MG tablet TAKE 1 TABLET BY MOUTH EVERY DAY 90 tablet 2  . vardenafil (LEVITRA) 20 MG tablet Take 20 mg by mouth daily as needed for erectile dysfunction.     No facility-administered medications prior to visit.      Allergies:   Sulfa antibiotics and Sulfur   Social History   Social History  . Marital status: Married    Spouse name: N/A  . Number of children: N/A  . Years of education: N/A   Social History Main Topics  . Smoking status: Former Smoker    Types: Cigarettes    Quit date: 10/07/1967  . Smokeless tobacco: Never Used  . Alcohol use Yes     Comment: wine with evening meal occasionally  . Drug use: No  . Sexual activity: Not Currently   Other Topics Concern   . None   Social History Narrative  . None     Family History:  The patient's family history includes Asthma in his mother; CAD in his father; Colon cancer in his maternal aunt; Heart attack in his father.   ROS:   Please see the history of present illness.    None  All other systems reviewed and are negative.   PHYSICAL EXAM:   VS:  BP 138/76 (BP Location: Left Arm)   Pulse (!) 52   Ht 5' 7.5" (1.715 m)   Wt 175 lb 6.4 oz (79.6 kg)   BMI 27.07 kg/m    GEN: Well nourished, well developed, in no acute distress  HEENT: normal  Neck: no JVD, carotid bruits, or masses Cardiac: RRR; no murmurs, rubs, or gallops,no edema  Respiratory:  clear to auscultation bilaterally, normal work of breathing GI: soft, nontender, nondistended, + BS MS: no deformity or atrophy  Skin: warm and dry, no rash Neuro:  Alert and Oriented x 3, Strength and sensation are intact Psych: euthymic mood, full affect  Wt Readings from Last 3 Encounters:  07/30/17 175 lb 6.4 oz (79.6 kg)  09/27/16 176 lb (79.8 kg)  07/25/16 170 lb 9.6 oz (77.4 kg)      Studies/Labs Reviewed:   EKG:  EKG  Sinus bradycardia 52 bpm with nonspecific T-wave flattening. No change compared to prior  Recent Labs: 07/31/2016: BUN 18; Creat 0.93; Potassium 4.0; Sodium 138 09/26/2016: ALT 26   Lipid Panel    Component Value Date/Time   CHOL 122 09/26/2016 1015   TRIG 114 09/26/2016 1015   HDL 45 09/26/2016 1015   CHOLHDL 2.7 09/26/2016 1015   VLDL 23 09/26/2016 1015   LDLCALC 54 09/26/2016 1015    Additional studies/ records that were reviewed today include:  Last LDL cholesterol performed in April 2018 was 107.    ASSESSMENT:    1. Coronary artery disease involving coronary bypass graft of native heart without angina pectoris   2. Essential hypertension   3. Other hyperlipidemia      PLAN:  In order of problems listed above:  1. Increase physical activity. He is asymptomatic. No imaging or functional  testing is required. 2. Excellent blood pressure control. Be mindful of caloric intake to help manage weight. Target blood pressure less than 140/85 mmHg. 3. Last LDL done in April was 107. He is not following his diet as closely as he should. His target should be 70. He is on pravastatin, a relatively weak statin. Changed  to atorvastatin 40 mg per day. Liver and lipid panel in 2 months.  Overall doing well. Monitor lipid status. Clinical follow-up in one year. Increase physical activity.    Medication Adjustments/Labs and Tests Ordered: Current medicines are reviewed at length with the patient today.  Concerns regarding medicines are outlined above.  Medication changes, Labs and Tests ordered today are listed in the Patient Instructions below. There are no Patient Instructions on file for this visit.   Signed, [name] Grooms, MD  07/30/2017 9:58 AM    [address], [place], [place]  [address] Phone: [phone]; Fax: [phone]

## 2017-07-31 DIAGNOSIS — E119 Type 2 diabetes mellitus without complications: Secondary | ICD-10-CM | POA: Diagnosis not present

## 2017-07-31 DIAGNOSIS — I2510 Atherosclerotic heart disease of native coronary artery without angina pectoris: Secondary | ICD-10-CM | POA: Diagnosis not present

## 2017-07-31 DIAGNOSIS — E7800 Pure hypercholesterolemia, unspecified: Secondary | ICD-10-CM | POA: Diagnosis not present

## 2017-07-31 DIAGNOSIS — I10 Essential (primary) hypertension: Secondary | ICD-10-CM | POA: Diagnosis not present

## 2017-08-01 ENCOUNTER — Encounter: Payer: Self-pay | Admitting: Interventional Cardiology

## 2017-08-02 ENCOUNTER — Encounter: Payer: Self-pay | Admitting: Interventional Cardiology

## 2017-08-11 ENCOUNTER — Other Ambulatory Visit: Payer: Self-pay | Admitting: Interventional Cardiology

## 2017-08-29 ENCOUNTER — Ambulatory Visit (INDEPENDENT_AMBULATORY_CARE_PROVIDER_SITE_OTHER): Payer: Medicare Other

## 2017-08-29 DIAGNOSIS — J309 Allergic rhinitis, unspecified: Secondary | ICD-10-CM

## 2017-09-10 ENCOUNTER — Other Ambulatory Visit: Payer: Self-pay

## 2017-09-10 ENCOUNTER — Encounter: Payer: Self-pay | Admitting: Family Medicine

## 2017-09-10 ENCOUNTER — Ambulatory Visit (INDEPENDENT_AMBULATORY_CARE_PROVIDER_SITE_OTHER): Payer: Self-pay | Admitting: Family Medicine

## 2017-09-10 VITALS — BP 138/72 | HR 70 | Temp 98.5°F | Resp 16 | Ht 66.93 in | Wt 171.8 lb

## 2017-09-10 DIAGNOSIS — Z024 Encounter for examination for driving license: Secondary | ICD-10-CM

## 2017-09-10 NOTE — Patient Instructions (Addendum)
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 Blood pressure was borderline elevated today, but eventually passed.   You will need a repeat stress test prior to next DOT physical next year, as these will be due every 2 years. You also need to continue yearly evaluations with your cardiologist.  Thanks for coming in today.     IF you received an x-ray today, you will receive an invoice from [facility]. Please contact [facility] at [phone] with questions or concerns regarding your invoice.   IF you received labwork today, you will receive an invoice from [facility]. Please contact [facility] at [phone] with questions or concerns regarding your invoice.   Our billing staff will not be able to assist you with questions regarding bills from these companies.  You will be contacted with the lab results as soon as they are available. The fastest way to get your results is to activate your My Chart account. Instructions are located on the last page of this paperwork. If you have not heard from [place] regarding the results in 2 weeks, please contact this office.

## 2017-09-10 NOTE — Progress Notes (Addendum)
Subjective:  By signing my name below, I, Essence Howell, attest that this documentation has been prepared under the direction and in the presence of Wendie Agreste, MD Electronically Signed: Ladene Artist, ED Scribe 09/10/2017 at 2:53 PM.   Patient ID: [name] [name], male    DOB: [birth year], 76 y.o.   MRN: [id number]  Chief Complaint  Patient presents with  . Annual Exam    DOT physical   HPI  [name] [name] is a 76 y.o. male who presents to Primary Care at [facility] for a DOT physical. H/o CAD, HTN, hyperlipidemia, DM, asthma.   CAD Last eval 10/2 with cardiology Dr. Tamala Julian. Had a CABG in 2008. Asymptomatic at that visit, no functional testing was required. Was changed to Lipitor 40 mg qd. Stress test with echo 07/2016 EF 53%, intermittent risk. Asymptomatic, no further workup. Denies cp, sob on exertion.   DM Last A1C was 6.8 on 10/3 with [facility]. Not on insulin; diet controlled DM. Denies hypoglycemic episodes, blurred vision.   Asthma Seen by allergist Dr. Jethro Bastos on 9/13. Restarted Qvar and Singulair and albuterol PRN. Allergic rhinitis; he is on Dymista. Denies cough, cough syncope, light-headedness.   He denies a h/o sleep apnea. He snores but no daytime somnolence. Pt reports a normal sleep study years ago.  Pt wears glasses to drive.   Visual Acuity Screening   Right eye Left eye Both eyes  Without correction:     With correction: 20/20 20/20 20/20   Comments: Color vision: passed Field of Vision- R 85                          L 85  Hearing Screening Comments: Whisper test R 10 feet                       L 10 feet   Patient Active Problem List   Diagnosis Date Noted  . Mild persistent asthma, uncomplicated 53/29/9242  . Allergic rhinitis 07/09/2015  . Coronary artery disease involving coronary bypass graft of native heart without angina pectoris 05/14/2014  . Hyperlipidemia 05/14/2014  . Essential hypertension 05/14/2014  . Type 2 diabetes mellitus with  complications ([place]) 68/34/1962   Past Medical History:  Diagnosis Date  . Allergic rhinitis    Immunotherapy, Bardelas  . BPH (benign prostatic hyperplasia)   . CAD (coronary artery disease)    CABG in 2008 with LIMA to LAD, SVG to Diag., SVG to OM1 and 2, SVG to PDA and PLOM, angioplasty x2 in 1990s, Dr. Tamala Julian.  . Constipation   . Coronary atherosclerosis of native coronary artery   . Diabetes mellitus type 2, uncontrolled ([place]) 11/2012  . ED (erectile dysfunction)   . Essential hypertension, benign    Good control  . Hemorrhoids   . Hyperlipidemia    Good control  . IBS (irritable bowel syndrome)    Hx of, with constipation   Past Surgical History:  Procedure Laterality Date  . CORONARY ANGIOPLASTY WITH STENT PLACEMENT  1990s   Angioplasty x2 in 1990s, Dr. Tamala Julian.  . CORONARY ARTERY BYPASS GRAFT  2008   With LIMA to LAD, SVG to Diag., SVG to OM1 and 2, SVG to PDA and PLOM, angioplasty x2 in 1990s, Dr. Tamala Julian.  . TONSILLECTOMY     teenager   Allergies  Allergen Reactions  . Sulfa Antibiotics Rash       . Sulfur Rash   Prior to  Admission medications   Medication Sig Start Date End Date Taking? Authorizing Provider  albuterol (PROVENTIL HFA;VENTOLIN HFA) 108 (90 BASE) MCG/ACT inhaler Inhale 2 puffs into the lungs every 4 (four) hours as needed for wheezing or shortness of breath. 08/12/15   Gean Quint, MD  aspirin 81 MG tablet Take 81 mg by mouth daily.     [provider]  atorvastatin (LIPITOR) 40 MG tablet Take 1 tablet (40 mg total) by mouth daily. 07/30/17 10/28/17  Belva Crome, MD  Azelastine-Fluticasone [facility]) 137-50 MCG/ACT SUSP Place 1 spray into the nose 2 (two) times daily. 09/27/16   Kennith Gain, MD  beclomethasone (QVAR) 80 MCG/ACT inhaler Inhale 2 puffs into the lungs 2 (two) times daily. To prevent cough or wheeze. Rinse, gargle, and spit out after use 08/12/15   Gean Quint, MD  clobetasol (TEMOVATE) 0.05 % external  solution Apply 1 application topically daily as needed (DANRUFF).  06/06/15   [provider]  EPINEPHrine (EPIPEN 2-PAK) 0.3 mg/0.3 mL IJ SOAJ injection Inject 0.3 mg into the muscle once.    [provider]  glucose blood test strip 1 each by Other route as needed for other (VERIGOLD). Use as instructed    [provider]  indapamide (LOZOL) 1.25 MG tablet TAKE 1 TABLET (1.25 MG TOTAL) BY MOUTH DAILY. 07/16/17   Belva Crome, MD  irbesartan (AVAPRO) 300 MG tablet Take 1 tablet (300 mg total) by mouth daily. 06/21/17   Belva Crome, MD  KLOR-CON M20 20 MEQ tablet  07/10/17   [provider]  Lancets MISC by Does not apply route.    [provider]  metoprolol succinate (TOPROL-XL) 50 MG 24 hr tablet TAKE 1 TABLET BY MOUTH DAILY WITH OR IMMEDIATELY FOLLOWING A MEAL 08/12/17   Belva Crome, MD  Multiple Vitamins-Minerals (MULTIVITAMIN PO) Take 1 tablet by mouth daily.     [provider]  nitroGLYCERIN (NITROSTAT) 0.4 MG SL tablet Place 0.4 mg under the tongue every 5 (five) minutes as needed for chest pain.    [provider]  OVER THE COUNTER MEDICATION Take 1 tablet by mouth 2 (two) times daily. (SUPER BETA PROSTATE)    [provider]  vardenafil (LEVITRA) 20 MG tablet Take 20 mg by mouth daily as needed for erectile dysfunction.    [provider]   Social History   Socioeconomic History  . Marital status: Married    Spouse name: Not on file  . Number of children: Not on file  . Years of education: Not on file  . Highest education level: Not on file  Social Needs  . Financial resource strain: Not on file  . Food insecurity - worry: Not on file  . Food insecurity - inability: Not on file  . Transportation needs - medical: Not on file  . Transportation needs - non-medical: Not on file  Occupational History  . Not on file  Tobacco Use  . Smoking status: Former Smoker    Types: Cigarettes    Last attempt  to quit: 10/07/1967    Years since quitting: 49.9  . Smokeless tobacco: Never Used  Substance and Sexual Activity  . Alcohol use: Yes    Comment: wine with evening meal occasionally  . Drug use: No  . Sexual activity: Not Currently  Other Topics Concern  . Not on file  Social History Narrative  . Not on file   Review of Systems  Eyes: Negative for  visual disturbance.  Respiratory: Negative for cough and shortness of breath.   Cardiovascular: Negative for chest pain.  Neurological: Negative for syncope and light-headedness.  Psychiatric/Behavioral: Negative for sleep disturbance.      Objective:   Physical Exam  Constitutional: He is oriented to person, place, and time. He appears well-developed and well-nourished.  HENT:  Head: Normocephalic and atraumatic.  Right Ear: External ear normal.  Left Ear: External ear normal.  Mouth/Throat: Oropharynx is clear and moist.  Eyes: Conjunctivae and EOM are normal. Pupils are equal, round, and reactive to light.  Neck: Normal range of motion. Neck supple. No thyromegaly present.  Cardiovascular: Normal rate, regular rhythm, normal heart sounds and intact distal pulses.  Pulmonary/Chest: Effort normal and breath sounds normal. No respiratory distress. He has no wheezes.  Abdominal: Soft. He exhibits no distension. There is no tenderness. Hernia confirmed negative in the right inguinal area and confirmed negative in the left inguinal area.  Musculoskeletal: Normal range of motion. He exhibits no edema or tenderness.  Lymphadenopathy:    He has no cervical adenopathy.  Neurological: He is alert and oriented to person, place, and time. He has normal reflexes.  Skin: Skin is warm and dry.  Psychiatric: He has a normal mood and affect. His behavior is normal.  Vitals reviewed.    Vitals:   09/10/17 1437 09/10/17 1537  BP: (!) 146/78 138/72  Pulse: 70   Resp: 16   Temp: 98.5 F (36.9 C)   TempSrc: Oral   SpO2: 98%   Weight: 171 lb  12.8 oz (77.9 kg)   Height: 5' 6.93" (1.7 m)       Assessment & Plan:  [name] [name] is a 76 y.o. male Encounter for commercial driver medical examination (CDME)  - DOT physical, history of CAD status post CABG, asymptomatic, has had a recent cardiology evaluation, and stress testing has been within 2 years. Diet-controlled diabetes with recent A1c controlled. Hypertension borderline but ultimately passed on repeat testing.  - See DOT paperwork.  No orders of the defined types were placed in this encounter.  Patient Instructions   One year card provided for DOT physical due to history of heart disease, diabetes, high blood pressure. Blood pressure was borderline elevated today, but eventually passed.   You will need a repeat stress test prior to next DOT physical next year, as these will be due every 2 years. You also need to continue yearly evaluations with your cardiologist.  Thanks for coming in today.     IF you received an x-ray today, you will receive an invoice from [facility]. Please contact [facility] at [phone] with questions or concerns regarding your invoice.   IF you received labwork today, you will receive an invoice from [facility]. Please contact [facility] at [phone] with questions or concerns regarding your invoice.   Our billing staff will not be able to assist you with questions regarding bills from these companies.  You will be contacted with the lab results as soon as they are available. The fastest way to get your results is to activate your My Chart account. Instructions are located on the last page of this paperwork. If you have not heard from [place] regarding the results in 2 weeks, please contact this office.      I personally performed the services described in this documentation, which was scribed in my presence. The recorded information has been reviewed and considered for accuracy and completeness, addended by me as needed, and  agree with information above.  Signed,   Merri Ray, MD Primary [facility] at [facility].  09/10/17 3:35 PM

## 2017-09-27 ENCOUNTER — Ambulatory Visit (INDEPENDENT_AMBULATORY_CARE_PROVIDER_SITE_OTHER): Payer: Medicare Other

## 2017-09-27 DIAGNOSIS — J309 Allergic rhinitis, unspecified: Secondary | ICD-10-CM

## 2017-10-08 ENCOUNTER — Other Ambulatory Visit: Payer: Medicare Other

## 2017-10-09 ENCOUNTER — Other Ambulatory Visit: Payer: Self-pay | Admitting: *Deleted

## 2017-10-09 ENCOUNTER — Encounter: Payer: Self-pay | Admitting: Interventional Cardiology

## 2017-10-09 DIAGNOSIS — E7849 Other hyperlipidemia: Secondary | ICD-10-CM

## 2017-10-10 ENCOUNTER — Other Ambulatory Visit: Payer: Medicare Other | Admitting: *Deleted

## 2017-10-10 DIAGNOSIS — E7849 Other hyperlipidemia: Secondary | ICD-10-CM

## 2017-10-10 LAB — LIPID PANEL
Chol/HDL Ratio: 2.5 ratio (ref 0.0–5.0)
Cholesterol, Total: 117 mg/dL (ref 100–199)
HDL: 47 mg/dL (ref >39)
LDL Calculated: 51 mg/dL (ref 0–99)
Triglycerides: 96 mg/dL (ref 0–149)
VLDL Cholesterol Cal: 19 mg/dL (ref 5–40)

## 2017-10-10 LAB — HEPATIC FUNCTION PANEL
ALBUMIN: 4.4 g/dL (ref 3.5–4.8)
ALK PHOS: 47 IU/L (ref 39–117)
ALT: 30 IU/L (ref 0–44)
AST: 31 IU/L (ref 0–40)
BILIRUBIN, DIRECT: 0.28 mg/dL (ref 0.00–0.40)
Bilirubin Total: 0.9 mg/dL (ref 0.0–1.2)
TOTAL PROTEIN: 6.9 g/dL (ref 6.0–8.5)

## 2017-10-13 ENCOUNTER — Other Ambulatory Visit: Payer: Self-pay | Admitting: Allergy

## 2017-10-13 DIAGNOSIS — H1010 Acute atopic conjunctivitis, unspecified eye: Secondary | ICD-10-CM

## 2017-10-13 DIAGNOSIS — J309 Allergic rhinitis, unspecified: Principal | ICD-10-CM

## 2017-10-14 NOTE — Telephone Encounter (Signed)
RF on Dymista x 5 at [facility]

## 2017-10-16 ENCOUNTER — Other Ambulatory Visit: Payer: Self-pay | Admitting: Interventional Cardiology

## 2017-10-25 DIAGNOSIS — L218 Other seborrheic dermatitis: Secondary | ICD-10-CM | POA: Diagnosis not present

## 2017-11-05 ENCOUNTER — Ambulatory Visit (INDEPENDENT_AMBULATORY_CARE_PROVIDER_SITE_OTHER): Payer: Medicare Other | Admitting: *Deleted

## 2017-11-05 DIAGNOSIS — J309 Allergic rhinitis, unspecified: Secondary | ICD-10-CM | POA: Diagnosis not present

## 2017-11-06 ENCOUNTER — Encounter: Payer: Self-pay | Admitting: *Deleted

## 2017-11-06 DIAGNOSIS — J3089 Other allergic rhinitis: Secondary | ICD-10-CM | POA: Diagnosis not present

## 2017-11-06 NOTE — Progress Notes (Signed)
VIAL MADE EXP: 11-06-18. HV 

## 2017-12-02 ENCOUNTER — Ambulatory Visit (INDEPENDENT_AMBULATORY_CARE_PROVIDER_SITE_OTHER): Payer: Medicare Other | Admitting: *Deleted

## 2017-12-02 DIAGNOSIS — J309 Allergic rhinitis, unspecified: Secondary | ICD-10-CM | POA: Diagnosis not present

## 2018-01-03 ENCOUNTER — Ambulatory Visit (INDEPENDENT_AMBULATORY_CARE_PROVIDER_SITE_OTHER): Payer: Medicare Other

## 2018-01-03 DIAGNOSIS — J309 Allergic rhinitis, unspecified: Secondary | ICD-10-CM

## 2018-01-29 ENCOUNTER — Ambulatory Visit (INDEPENDENT_AMBULATORY_CARE_PROVIDER_SITE_OTHER): Payer: Medicare Other | Admitting: *Deleted

## 2018-01-29 DIAGNOSIS — J309 Allergic rhinitis, unspecified: Secondary | ICD-10-CM | POA: Diagnosis not present

## 2018-02-04 ENCOUNTER — Ambulatory Visit (INDEPENDENT_AMBULATORY_CARE_PROVIDER_SITE_OTHER): Payer: Medicare Other | Admitting: *Deleted

## 2018-02-04 DIAGNOSIS — J309 Allergic rhinitis, unspecified: Secondary | ICD-10-CM | POA: Diagnosis not present

## 2018-02-12 ENCOUNTER — Ambulatory Visit (INDEPENDENT_AMBULATORY_CARE_PROVIDER_SITE_OTHER): Payer: Medicare Other | Admitting: *Deleted

## 2018-02-12 DIAGNOSIS — J309 Allergic rhinitis, unspecified: Secondary | ICD-10-CM | POA: Diagnosis not present

## 2018-02-21 ENCOUNTER — Ambulatory Visit (INDEPENDENT_AMBULATORY_CARE_PROVIDER_SITE_OTHER): Payer: Medicare Other

## 2018-02-21 DIAGNOSIS — J309 Allergic rhinitis, unspecified: Secondary | ICD-10-CM | POA: Diagnosis not present

## 2018-02-28 ENCOUNTER — Ambulatory Visit (INDEPENDENT_AMBULATORY_CARE_PROVIDER_SITE_OTHER): Payer: Medicare Other

## 2018-02-28 DIAGNOSIS — J309 Allergic rhinitis, unspecified: Secondary | ICD-10-CM

## 2018-03-27 ENCOUNTER — Ambulatory Visit (INDEPENDENT_AMBULATORY_CARE_PROVIDER_SITE_OTHER): Payer: Medicare Other | Admitting: *Deleted

## 2018-03-27 DIAGNOSIS — Z125 Encounter for screening for malignant neoplasm of prostate: Secondary | ICD-10-CM | POA: Diagnosis not present

## 2018-03-27 DIAGNOSIS — J309 Allergic rhinitis, unspecified: Secondary | ICD-10-CM

## 2018-03-27 DIAGNOSIS — E1169 Type 2 diabetes mellitus with other specified complication: Secondary | ICD-10-CM | POA: Diagnosis not present

## 2018-03-27 DIAGNOSIS — E7800 Pure hypercholesterolemia, unspecified: Secondary | ICD-10-CM | POA: Diagnosis not present

## 2018-03-27 DIAGNOSIS — Z1389 Encounter for screening for other disorder: Secondary | ICD-10-CM | POA: Diagnosis not present

## 2018-03-27 DIAGNOSIS — Z0000 Encounter for general adult medical examination without abnormal findings: Secondary | ICD-10-CM | POA: Diagnosis not present

## 2018-03-27 DIAGNOSIS — I10 Essential (primary) hypertension: Secondary | ICD-10-CM | POA: Diagnosis not present

## 2018-04-22 ENCOUNTER — Ambulatory Visit (INDEPENDENT_AMBULATORY_CARE_PROVIDER_SITE_OTHER): Payer: Medicare Other | Admitting: *Deleted

## 2018-04-22 DIAGNOSIS — J309 Allergic rhinitis, unspecified: Secondary | ICD-10-CM | POA: Diagnosis not present

## 2018-05-12 DIAGNOSIS — Z23 Encounter for immunization: Secondary | ICD-10-CM | POA: Diagnosis not present

## 2018-05-15 ENCOUNTER — Ambulatory Visit (INDEPENDENT_AMBULATORY_CARE_PROVIDER_SITE_OTHER): Payer: Medicare Other | Admitting: *Deleted

## 2018-05-15 DIAGNOSIS — J309 Allergic rhinitis, unspecified: Secondary | ICD-10-CM

## 2018-06-03 DIAGNOSIS — H40013 Open angle with borderline findings, low risk, bilateral: Secondary | ICD-10-CM | POA: Diagnosis not present

## 2018-06-03 DIAGNOSIS — H25813 Combined forms of age-related cataract, bilateral: Secondary | ICD-10-CM | POA: Diagnosis not present

## 2018-06-03 DIAGNOSIS — E119 Type 2 diabetes mellitus without complications: Secondary | ICD-10-CM | POA: Diagnosis not present

## 2018-06-03 DIAGNOSIS — H524 Presbyopia: Secondary | ICD-10-CM | POA: Diagnosis not present

## 2018-06-09 ENCOUNTER — Other Ambulatory Visit: Payer: Self-pay | Admitting: Interventional Cardiology

## 2018-06-11 ENCOUNTER — Encounter: Payer: Self-pay | Admitting: *Deleted

## 2018-06-11 DIAGNOSIS — J3089 Other allergic rhinitis: Secondary | ICD-10-CM | POA: Diagnosis not present

## 2018-06-11 NOTE — Progress Notes (Signed)
Vials made. Exp: 06-12-19. hv 

## 2018-06-16 ENCOUNTER — Ambulatory Visit (INDEPENDENT_AMBULATORY_CARE_PROVIDER_SITE_OTHER): Payer: Medicare Other | Admitting: *Deleted

## 2018-06-16 DIAGNOSIS — J309 Allergic rhinitis, unspecified: Secondary | ICD-10-CM

## 2018-07-14 DIAGNOSIS — Z23 Encounter for immunization: Secondary | ICD-10-CM | POA: Diagnosis not present

## 2018-07-15 ENCOUNTER — Ambulatory Visit (INDEPENDENT_AMBULATORY_CARE_PROVIDER_SITE_OTHER): Payer: Medicare Other | Admitting: *Deleted

## 2018-07-15 DIAGNOSIS — J309 Allergic rhinitis, unspecified: Secondary | ICD-10-CM | POA: Diagnosis not present

## 2018-08-02 ENCOUNTER — Other Ambulatory Visit: Payer: Self-pay | Admitting: Interventional Cardiology

## 2018-08-12 ENCOUNTER — Ambulatory Visit (INDEPENDENT_AMBULATORY_CARE_PROVIDER_SITE_OTHER): Payer: Medicare Other | Admitting: *Deleted

## 2018-08-12 DIAGNOSIS — J309 Allergic rhinitis, unspecified: Secondary | ICD-10-CM | POA: Diagnosis not present

## 2018-08-18 ENCOUNTER — Ambulatory Visit (INDEPENDENT_AMBULATORY_CARE_PROVIDER_SITE_OTHER): Payer: Medicare Other

## 2018-08-18 DIAGNOSIS — J309 Allergic rhinitis, unspecified: Secondary | ICD-10-CM

## 2018-08-28 ENCOUNTER — Ambulatory Visit (INDEPENDENT_AMBULATORY_CARE_PROVIDER_SITE_OTHER): Payer: Medicare Other | Admitting: *Deleted

## 2018-08-28 DIAGNOSIS — J309 Allergic rhinitis, unspecified: Secondary | ICD-10-CM

## 2018-08-31 NOTE — Progress Notes (Signed)
Cardiology Office Note:    Date:  09/01/2018   ID:  [name] [name], DOB Oct 16, 1941, MRN [id number]  PCP:  [name] Orn, MD  Cardiologist:  [name] Grooms, MD   Referring MD: [name] Orn, MD   Chief Complaint  Patient presents with  . Coronary Artery Disease    History of Present Illness:    [name] [name] is a 77 y.o. male with a hx of CABG 2008, hypertension, DM2, and hyperlipidemia.  [name] [name] is now responsible for his wife's care.  She was diagnosed with Alzheimer's.  He is diligent in the responsibility but notes that he has no time for exercise and taking care of himself.  He is still quite active in the things he has to do around his house.  He has not had chest pain, orthopnea, PND, nitroglycerin use, palpitations, or syncope.  He denies claudication.  Past Medical History:  Diagnosis Date  . Allergic rhinitis    Immunotherapy, [place]  . BPH (benign prostatic hyperplasia)   . CAD (coronary artery disease)    CABG in 2008 with LIMA to LAD, SVG to Diag., SVG to OM1 and 2, SVG to PDA and PLOM, angioplasty x2 in 1990s, Dr. Tamala [name].  . Constipation   . Coronary atherosclerosis of native coronary artery   . Diabetes mellitus type 2, uncontrolled ([place]) 11/2012  . ED (erectile dysfunction)   . Essential hypertension, benign    Good control  . Hemorrhoids   . Hyperlipidemia    Good control  . IBS (irritable bowel syndrome)    Hx of, with constipation    Past Surgical History:  Procedure Laterality Date  . CORONARY ANGIOPLASTY WITH STENT PLACEMENT  1990s   Angioplasty x2 in 1990s, Dr. Tamala [name].  . CORONARY ARTERY BYPASS GRAFT  2008   With LIMA to LAD, SVG to Diag., SVG to OM1 and 2, SVG to PDA and PLOM, angioplasty x2 in 1990s, Dr. Tamala [name].  . TONSILLECTOMY     teenager    Current Medications: Current Meds  Medication Sig  . albuterol (PROVENTIL HFA;VENTOLIN HFA) 108 (90 BASE) MCG/ACT inhaler Inhale 2 puffs into the lungs every 4 (four) hours as needed for  wheezing or shortness of breath.  Marland Kitchen aspirin 81 MG tablet Take 81 mg by mouth daily.   Marland Kitchen atorvastatin (LIPITOR) 40 MG tablet Take 1 tablet (40 mg total) by mouth daily.  . beclomethasone (QVAR) 80 MCG/ACT inhaler Inhale 2 puffs into the lungs 2 (two) times daily. To prevent cough or wheeze. Rinse, gargle, and spit out after use  . clobetasol (TEMOVATE) 0.05 % external solution Apply 1 application topically daily as needed (DANRUFF).   . DYMISTA 137-50 MCG/ACT SUSP PLACE 1 SPRAY INTO THE NOSE 2 (TWO) TIMES DAILY.  Marland Kitchen EPINEPHrine (EPIPEN 2-PAK) 0.3 mg/0.3 mL IJ SOAJ injection Inject 0.3 mg into the muscle once.  Marland Kitchen glucose blood test strip 1 each by Other route as needed for other (VERIGOLD). Use as instructed  . indapamide (LOZOL) 1.25 MG tablet Take 1 tablet (1.25 mg total) by mouth daily.  . irbesartan (AVAPRO) 300 MG tablet Take 1 tablet (300 mg total) by mouth daily. Please make yearly appt with Dr. Tamala [name] for October for future refills. 1st attempt  . KLOR-CON M20 20 MEQ tablet   . Lancets MISC by Does not apply route.  . metoprolol succinate (TOPROL-XL) 50 MG 24 hr tablet TAKE 1 TABLET BY MOUTH DAILY WITH OR IMMEDIATELY FOLLOWING A MEAL  . Multiple Vitamins-Minerals (MULTIVITAMIN  PO) Take 1 tablet by mouth daily.   . nitroGLYCERIN (NITROSTAT) 0.4 MG SL tablet Place 0.4 mg under the tongue every 5 (five) minutes as needed for chest pain.  Marland Kitchen OVER THE COUNTER MEDICATION Take 1 tablet by mouth 2 (two) times daily. (SUPER BETA PROSTATE)  . vardenafil (LEVITRA) 20 MG tablet Take 20 mg by mouth daily as needed for erectile dysfunction.     Allergies:   Sulfa antibiotics and Sulfur   Social History   Socioeconomic History  . Marital status: Married    Spouse name: Not on file  . Number of children: Not on file  . Years of education: Not on file  . Highest education level: Not on file  Occupational History  . Not on file  Social Needs  . Financial resource strain: Not on file  . Food  insecurity:    Worry: Not on file    Inability: Not on file  . Transportation needs:    Medical: Not on file    Non-medical: Not on file  Tobacco Use  . Smoking status: Former Smoker    Types: Cigarettes    Last attempt to quit: 10/07/1967    Years since quitting: 50.9  . Smokeless tobacco: Never Used  Substance and Sexual Activity  . Alcohol use: Yes    Comment: wine with evening meal occasionally  . Drug use: No  . Sexual activity: Not Currently  Lifestyle  . Physical activity:    Days per week: Not on file    Minutes per session: Not on file  . Stress: Not on file  Relationships  . Social connections:    Talks on phone: Not on file    Gets together: Not on file    Attends religious service: Not on file    Active member of club or organization: Not on file    Attends meetings of clubs or organizations: Not on file    Relationship status: Not on file  Other Topics Concern  . Not on file  Social History Narrative  . Not on file     Family History: The patient's family history includes Asthma in his mother; CAD in his father; Colon cancer in his maternal aunt; Heart attack in his father.  ROS:   Please see the history of present illness.    Notes difficulty sleeping, anxiety, difficulty urinating, frequent urination at night, headaches he feels are stress related.  All other systems reviewed and are negative.  EKGs/Labs/Other Studies Reviewed:    The following studies were reviewed today: None  EKG:  EKG is  ordered today.  The ekg ordered today demonstrates normal sinus rhythm with slight left axis deviation, first-degree AV block at 224 ms PR interval.  QS pattern V1 and V2.  When compared to prior tracings, QS pattern V1 and V2 is new and probably related to lead position.  Otherwise no change.  Recent Labs: 10/10/2017: ALT 30  Recent Lipid Panel    Component Value Date/Time   CHOL 117 10/10/2017 0931   TRIG 96 10/10/2017 0931   HDL 47 10/10/2017 0931    CHOLHDL 2.5 10/10/2017 0931   CHOLHDL 2.7 09/26/2016 1015   VLDL 23 09/26/2016 1015   LDLCALC 51 10/10/2017 0931    Physical Exam:    VS:  BP 132/82   Pulse 60   Ht 5\' 6"  (1.676 m)   Wt 173 lb (78.5 kg)   SpO2 99%   BMI 27.92 kg/m     Wt  Readings from Last 3 Encounters:  09/01/18 173 lb (78.5 kg)  09/10/17 171 lb 12.8 oz (77.9 kg)  07/30/17 175 lb 6.4 oz (79.6 kg)     GEN:  Well nourished, well developed in no acute distress HEENT: Normal NECK: No JVD. LYMPHATICS: No lymphadenopathy CARDIAC: RRR, no murmur, no gallop, no edema. VASCULAR: 2+ bilateral radial, carotid, and posterior tibial bilateral pulses.  No bruits. RESPIRATORY:  Clear to auscultation without rales, wheezing or rhonchi  ABDOMEN: Soft, non-tender, non-distended, No pulsatile mass, MUSCULOSKELETAL: No deformity  SKIN: Warm and dry NEUROLOGIC:  Alert and oriented x 3 PSYCHIATRIC:  Normal affect   ASSESSMENT:    1. Coronary artery disease involving coronary bypass graft of native heart without angina pectoris   2. Other hyperlipidemia   3. Essential hypertension    PLAN:    In order of problems listed above:  1. Patient is doing well.  Does not have angina.  We had full discussion concerning secondary risk prevention.  He currently is lacking in the 150 minutes of moderate aerobic activity per week due to constraints imposed by his wife's dementia. 2. LDL target less than 70 with most recent LDL significantly lower than target.  No change in therapy. 3. Blood pressures under excellent control.  Target 130/80 mmHg.  Overall education and awareness concerning primary/secondary risk prevention was discussed in detail: LDL less than 70, hemoglobin A1c less than 7, blood pressure target less than 130/80 mmHg, >150 minutes of moderate aerobic activity per week, avoidance of smoking, weight control (via diet and exercise), and continued surveillance/management of/for obstructive sleep apnea.    Medication  Adjustments/Labs and Tests Ordered: Current medicines are reviewed at length with the patient today.  Concerns regarding medicines are outlined above.  Orders Placed This Encounter  Procedures  . EKG 12-Lead   No orders of the defined types were placed in this encounter.   Patient Instructions  Medication Instructions:  Your physician recommends that you continue on your current medications as directed. Please refer to the Current Medication list given to you today.  If you need a refill on your cardiac medications before your next appointment, please call your pharmacy.   Lab work: None If you have labs (blood work) drawn today and your tests are completely normal, you will receive your results only by: Marland Kitchen MyChart Message (if you have MyChart) OR . A paper copy in the mail If you have any lab test that is abnormal or we need to change your treatment, we will call you to review the results.  Testing/Procedures: None  Follow-Up: At [facility], you and your health needs are our priority.  As part of our continuing mission to provide you with exceptional heart care, we have created designated Provider Care Teams.  These Care Teams include your primary Cardiologist (physician) and Advanced Practice Providers (APPs -  Physician Assistants and Nurse Practitioners) who all work together to provide you with the care you need, when you need it. You will need a follow up appointment in 12 months.  Please call our office 2 months in advance to schedule this appointment.  You may see [name] Grooms, MD or one of the following Advanced Practice Providers on your designated Care Team:   Truitt Merle, NP Cecilie Kicks, NP . Kathyrn Drown, NP  Any Other Special Instructions Will Be Listed Below (If Applicable).       Signed, [name] Grooms, MD  09/01/2018 4:09 PM  [facility]

## 2018-09-01 ENCOUNTER — Ambulatory Visit (INDEPENDENT_AMBULATORY_CARE_PROVIDER_SITE_OTHER): Payer: Medicare Other | Admitting: Interventional Cardiology

## 2018-09-01 ENCOUNTER — Encounter: Payer: Self-pay | Admitting: Interventional Cardiology

## 2018-09-01 VITALS — BP 132/82 | HR 60 | Ht 66.0 in | Wt 173.0 lb

## 2018-09-01 DIAGNOSIS — I25810 Atherosclerosis of coronary artery bypass graft(s) without angina pectoris: Secondary | ICD-10-CM | POA: Diagnosis not present

## 2018-09-01 DIAGNOSIS — I10 Essential (primary) hypertension: Secondary | ICD-10-CM | POA: Diagnosis not present

## 2018-09-01 DIAGNOSIS — E7849 Other hyperlipidemia: Secondary | ICD-10-CM | POA: Diagnosis not present

## 2018-09-01 NOTE — Patient Instructions (Signed)

## 2018-09-03 ENCOUNTER — Ambulatory Visit (INDEPENDENT_AMBULATORY_CARE_PROVIDER_SITE_OTHER): Payer: Medicare Other | Admitting: *Deleted

## 2018-09-03 DIAGNOSIS — J309 Allergic rhinitis, unspecified: Secondary | ICD-10-CM | POA: Diagnosis not present

## 2018-09-10 ENCOUNTER — Ambulatory Visit (INDEPENDENT_AMBULATORY_CARE_PROVIDER_SITE_OTHER): Payer: Medicare Other

## 2018-09-10 DIAGNOSIS — J309 Allergic rhinitis, unspecified: Secondary | ICD-10-CM

## 2018-09-12 ENCOUNTER — Other Ambulatory Visit: Payer: Self-pay | Admitting: Interventional Cardiology

## 2018-09-17 ENCOUNTER — Telehealth: Payer: Self-pay | Admitting: *Deleted

## 2018-09-17 NOTE — Telephone Encounter (Signed)
Received vm from [facility] stating that the irbesartan is on backorder and they are requesting an alternative to be sent in. Please advise. Thanks, MI

## 2018-09-18 NOTE — Telephone Encounter (Signed)
Pt on Irbesartan 300mg  QD but it is on back order.  What alternative would you like?

## 2018-09-19 MED ORDER — OLMESARTAN MEDOXOMIL 40 MG PO TABS: 40.0000 mg | ORAL_TABLET | Freq: Every day | ORAL | 3 refills | Status: DC

## 2018-09-19 NOTE — Telephone Encounter (Signed)
Olmesartan 40 mg daily

## 2018-09-19 NOTE — Telephone Encounter (Signed)
Spoke with pt and made him aware of change.  Pt verbalized understanding and was appreciative for call.

## 2018-09-29 DIAGNOSIS — I10 Essential (primary) hypertension: Secondary | ICD-10-CM | POA: Diagnosis not present

## 2018-09-29 DIAGNOSIS — E1169 Type 2 diabetes mellitus with other specified complication: Secondary | ICD-10-CM | POA: Diagnosis not present

## 2018-10-01 ENCOUNTER — Encounter: Payer: Self-pay | Admitting: Allergy

## 2018-10-01 ENCOUNTER — Ambulatory Visit (INDEPENDENT_AMBULATORY_CARE_PROVIDER_SITE_OTHER): Payer: Medicare Other | Admitting: Allergy

## 2018-10-01 VITALS — BP 130/70 | HR 58 | Temp 98.3°F | Resp 20 | Ht 67.5 in | Wt 172.6 lb

## 2018-10-01 DIAGNOSIS — J3089 Other allergic rhinitis: Secondary | ICD-10-CM

## 2018-10-01 DIAGNOSIS — H1013 Acute atopic conjunctivitis, bilateral: Secondary | ICD-10-CM | POA: Diagnosis not present

## 2018-10-01 DIAGNOSIS — I25810 Atherosclerosis of coronary artery bypass graft(s) without angina pectoris: Secondary | ICD-10-CM

## 2018-10-01 DIAGNOSIS — J4530 Mild persistent asthma, uncomplicated: Secondary | ICD-10-CM

## 2018-10-01 NOTE — Progress Notes (Signed)
Follow-up Note  RE: [name] [name] MRN: [id number] DOB: 27-Sep-1941 Date of Office Visit: 10/01/2018   History of present illness: [name] [name] is a 77 y.o. male presenting today for follow-up of asthma and allergic rhinitis with conjunctivitis.  He was last seen in the office on July 11, 2017 by myself.  He states he has had a good year without any major health changes, surgeries or hospitalizations.  He does believe back in January February of this year that he has some asthma symptoms and Qvar and Singulair.  He states December through February is usually worse at time of the year for him and his asthma.  He only uses the Qvar and Singulair when he has a symptomatic stretch of cough and wheeze and shortness of breath.  He states thus far in December he has not had any increase of symptoms does not require use of albuterol.  He also has not required any use of oral steroids or any ED or urgent care or hospitalizations stays. With his allergic rhinitis and conjunctivitis he states he is also been doing well this fall.  He will use Claritin as needed but states he has not needed to use this.  He also will use Dymista as needed and states that this nasal spray works very well for him.  He is on allergen immunotherapy and is tolerating these injections well without any large local or systemic reactions and he has access to an epinephrine device.  Review of systems: Review of Systems  Constitutional: Negative for chills, fever and malaise/fatigue.  HENT: Negative for congestion, ear discharge, ear pain, nosebleeds and sore throat.   Eyes: Negative for pain, discharge and redness.  Respiratory: Negative for cough, shortness of breath and wheezing.   Cardiovascular: Negative for chest pain.  Gastrointestinal: Negative for abdominal pain, constipation, diarrhea, heartburn, nausea and vomiting.  Musculoskeletal: Negative for joint pain.  Skin: Negative for itching and rash.  Neurological:  Negative for headaches.    All other systems negative unless noted above in HPI  Past medical/social/surgical/family history have been reviewed and are unchanged unless specifically indicated below.  No changes  Medication List: Allergies as of 10/01/2018      Reactions   Sulfa Antibiotics Rash      Sulfur Rash      Medication List        Accurate as of 10/01/18  1:36 PM. Always use your most recent med list.          albuterol 108 (90 Base) MCG/ACT inhaler Commonly known as:  PROVENTIL HFA;VENTOLIN HFA Inhale 2 puffs into the lungs every 4 (four) hours as needed for wheezing or shortness of breath.   aspirin 81 MG tablet Take 81 mg by mouth daily.   atorvastatin 40 MG tablet Commonly known as:  LIPITOR Take 1 tablet (40 mg total) by mouth daily.   beclomethasone 80 MCG/ACT inhaler Commonly known as:  QVAR Inhale 2 puffs into the lungs 2 (two) times daily. To prevent cough or wheeze. Rinse, gargle, and spit out after use   clobetasol 0.05 % external solution Commonly known as:  TEMOVATE Apply 1 application topically daily as needed (DANRUFF).   DYMISTA 137-50 MCG/ACT Susp Generic drug:  Azelastine-Fluticasone PLACE 1 SPRAY INTO THE NOSE 2 (TWO) TIMES DAILY.   EPIPEN 2-PAK 0.3 mg/0.3 mL Soaj injection Generic drug:  EPINEPHrine Inject 0.3 mg into the muscle once.   glucose blood test strip 1 each by Other route as needed  for other (VERIGOLD). Use as instructed   indapamide 1.25 MG tablet Commonly known as:  LOZOL Take 1 tablet (1.25 mg total) by mouth daily.   KLOR-CON M20 20 MEQ tablet Generic drug:  potassium chloride SA   Lancets Misc by Does not apply route.   metoprolol succinate 50 MG 24 hr tablet Commonly known as:  TOPROL-XL TAKE 1 TABLET BY MOUTH DAILY WITH OR IMMEDIATELY FOLLOWING A MEAL   MULTIVITAMIN PO Take 1 tablet by mouth daily.   nitroGLYCERIN 0.4 MG SL tablet Commonly known as:  NITROSTAT Place 0.4 mg under the tongue every 5  (five) minutes as needed for chest pain.   olmesartan 40 MG tablet Commonly known as:  BENICAR Take 1 tablet (40 mg total) by mouth daily.   OVER THE COUNTER MEDICATION Take 1 tablet by mouth 2 (two) times daily. (SUPER BETA PROSTATE)   pravastatin 40 MG tablet Commonly known as:  PRAVACHOL Take 40 mg by mouth daily.   vardenafil 20 MG tablet Commonly known as:  LEVITRA Take 20 mg by mouth daily as needed for erectile dysfunction.       Known medication allergies: Allergies  Allergen Reactions  . Sulfa Antibiotics Rash       . Sulfur Rash    Physical examination: Blood pressure 130/70, pulse (!) 58, temperature 98.3 F (36.8 C), temperature source Oral, resp. rate 20, height 5' 7.5" (1.715 m), weight 172 lb 9.6 oz (78.3 kg), SpO2 96 %.  General: Alert, interactive, in no acute distress. HEENT: PERRLA, TMs pearly gray, turbinates non-edematous without discharge, post-pharynx non erythematous. Neck: Supple without lymphadenopathy. Lungs: Clear to auscultation without wheezing, rhonchi or rales. {no increased work of breathing. CV: Normal S1, S2 without murmurs. Abdomen: Nondistended, nontender. Skin: Warm and dry, without lesions or rashes. Extremities:  No clubbing, cyanosis or edema. Neuro:   Grossly intact.  Diagnositics/Labs:  Spirometry: FEV1: 1.67L 65%, FVC: 2.03L 59% he did not meet ATS criteria.  He has had normal spirometry on last visit.  Assessment and plan:   Mild persistent asthma - Well-controlled.  I do not feel that his spirometry is reflective of his current status.  I did have him try again to do the spirometry with improved effort however it did not improve. - Continue albuterol 2 puffs every 4-6 hours as needed for cough, wheeze, shortness of breath - Asthma action plan: Start Qvar 80 g 2 puffs twice a day and Singulair 10mg  daily during respiratory illness/asthma exacerbation Asthma control goals:   Full participation in all desired  activities (may need albuterol before activity)  Albuterol use two time or less a week on average (not counting use with activity)  Cough interfering with sleep two time or less a month  Oral steroids no more than once a year  No hospitalizations  Allergic rhinoconjunctivitis - Avoidance: Dust Mite and Mold.       - Claritin 10mg  by mouth once daily for runny nose or itching as needed. - Continue Dymista 1-2 spray(s) each nostril daily for stuffy nose or drainage. --Refill today - Continue monthly allergen immunotherapy        -Epi-pen/Benadryl as needed.   Follow up Visit: 6 -12 months or sooner if needed.  I appreciate the opportunity to take part in [name] [name]'s care. Please do not hesitate to contact me with questions.  Sincerely,   Prudy Feeler, MD Allergy/Immunology [facility]

## 2018-10-01 NOTE — Patient Instructions (Addendum)
Mild persistent asthma - Well-controlled - Continue albuterol 2 puffs every 4-6 hours as needed for cough, wheeze, shortness of breath - Asthma action plan: Start Qvar 80 g 2 puffs twice a day and Singulair 10mg  daily during respiratory illness/asthma exacerbation Asthma control goals:   Full participation in all desired activities (may need albuterol before activity)  Albuterol use two time or less a week on average (not counting use with activity)  Cough interfering with sleep two time or less a month  Oral steroids no more than once a year  No hospitalizations  Allergic rhinoconjunctivitis - Avoidance: Dust Mite and Mold.       - Claritin 10mg  by mouth once daily for runny nose or itching as needed. - Continue Dymista 1-2 spray(s) each nostril daily for stuffy nose or drainage. --Refill today - Continue monthly allergen immunotherapy        -Epi-pen/Benadryl as needed.   Follow up Visit: 6-12 months or sooner if needed.

## 2018-10-10 ENCOUNTER — Other Ambulatory Visit: Payer: Self-pay | Admitting: Interventional Cardiology

## 2018-10-16 ENCOUNTER — Ambulatory Visit (INDEPENDENT_AMBULATORY_CARE_PROVIDER_SITE_OTHER): Payer: Medicare Other | Admitting: *Deleted

## 2018-10-16 DIAGNOSIS — J309 Allergic rhinitis, unspecified: Secondary | ICD-10-CM

## 2018-11-03 ENCOUNTER — Other Ambulatory Visit: Payer: Self-pay | Admitting: Interventional Cardiology

## 2018-11-12 ENCOUNTER — Ambulatory Visit (INDEPENDENT_AMBULATORY_CARE_PROVIDER_SITE_OTHER): Payer: Medicare Other | Admitting: *Deleted

## 2018-11-12 DIAGNOSIS — J309 Allergic rhinitis, unspecified: Secondary | ICD-10-CM | POA: Diagnosis not present

## 2018-12-03 ENCOUNTER — Ambulatory Visit (INDEPENDENT_AMBULATORY_CARE_PROVIDER_SITE_OTHER): Payer: Medicare Other

## 2018-12-03 DIAGNOSIS — J309 Allergic rhinitis, unspecified: Secondary | ICD-10-CM | POA: Diagnosis not present

## 2019-01-05 ENCOUNTER — Ambulatory Visit (INDEPENDENT_AMBULATORY_CARE_PROVIDER_SITE_OTHER): Payer: Medicare Other

## 2019-01-05 DIAGNOSIS — J309 Allergic rhinitis, unspecified: Secondary | ICD-10-CM

## 2019-01-22 NOTE — Progress Notes (Signed)
VIAL EXP 01-26-2020

## 2019-01-26 DIAGNOSIS — J3089 Other allergic rhinitis: Secondary | ICD-10-CM | POA: Diagnosis not present

## 2019-02-05 ENCOUNTER — Ambulatory Visit (INDEPENDENT_AMBULATORY_CARE_PROVIDER_SITE_OTHER): Payer: Medicare Other

## 2019-02-05 DIAGNOSIS — J309 Allergic rhinitis, unspecified: Secondary | ICD-10-CM

## 2019-02-24 ENCOUNTER — Other Ambulatory Visit: Payer: Self-pay | Admitting: *Deleted

## 2019-02-24 MED ORDER — VALSARTAN 320 MG PO TABS: 320.0000 mg | ORAL_TABLET | Freq: Every day | ORAL | 3 refills | Status: AC

## 2019-03-02 ENCOUNTER — Ambulatory Visit (INDEPENDENT_AMBULATORY_CARE_PROVIDER_SITE_OTHER): Payer: Medicare Other

## 2019-03-02 DIAGNOSIS — E7800 Pure hypercholesterolemia, unspecified: Secondary | ICD-10-CM | POA: Diagnosis not present

## 2019-03-02 DIAGNOSIS — E1169 Type 2 diabetes mellitus with other specified complication: Secondary | ICD-10-CM | POA: Diagnosis not present

## 2019-03-02 DIAGNOSIS — E1165 Type 2 diabetes mellitus with hyperglycemia: Secondary | ICD-10-CM | POA: Diagnosis not present

## 2019-03-02 DIAGNOSIS — J309 Allergic rhinitis, unspecified: Secondary | ICD-10-CM | POA: Diagnosis not present

## 2019-04-01 ENCOUNTER — Ambulatory Visit (INDEPENDENT_AMBULATORY_CARE_PROVIDER_SITE_OTHER): Payer: Medicare Other

## 2019-04-01 DIAGNOSIS — J309 Allergic rhinitis, unspecified: Secondary | ICD-10-CM

## 2019-04-09 DIAGNOSIS — Z0000 Encounter for general adult medical examination without abnormal findings: Secondary | ICD-10-CM | POA: Diagnosis not present

## 2019-04-09 DIAGNOSIS — Z1389 Encounter for screening for other disorder: Secondary | ICD-10-CM | POA: Diagnosis not present

## 2019-04-10 ENCOUNTER — Ambulatory Visit (INDEPENDENT_AMBULATORY_CARE_PROVIDER_SITE_OTHER): Payer: Medicare Other | Admitting: *Deleted

## 2019-04-10 DIAGNOSIS — J309 Allergic rhinitis, unspecified: Secondary | ICD-10-CM | POA: Diagnosis not present

## 2019-04-16 ENCOUNTER — Ambulatory Visit (INDEPENDENT_AMBULATORY_CARE_PROVIDER_SITE_OTHER): Payer: Medicare Other

## 2019-04-16 DIAGNOSIS — J309 Allergic rhinitis, unspecified: Secondary | ICD-10-CM | POA: Diagnosis not present

## 2019-04-21 ENCOUNTER — Other Ambulatory Visit: Payer: Self-pay | Admitting: *Deleted

## 2019-04-21 DIAGNOSIS — H1010 Acute atopic conjunctivitis, unspecified eye: Secondary | ICD-10-CM

## 2019-04-21 MED ORDER — AZELASTINE-FLUTICASONE 137-50 MCG/ACT NA SUSP: NASAL | 5 refills | Status: DC

## 2019-04-23 ENCOUNTER — Ambulatory Visit (INDEPENDENT_AMBULATORY_CARE_PROVIDER_SITE_OTHER): Payer: Medicare Other | Admitting: *Deleted

## 2019-04-23 DIAGNOSIS — E1169 Type 2 diabetes mellitus with other specified complication: Secondary | ICD-10-CM | POA: Diagnosis not present

## 2019-04-23 DIAGNOSIS — J309 Allergic rhinitis, unspecified: Secondary | ICD-10-CM | POA: Diagnosis not present

## 2019-04-23 DIAGNOSIS — I2510 Atherosclerotic heart disease of native coronary artery without angina pectoris: Secondary | ICD-10-CM | POA: Diagnosis not present

## 2019-04-23 DIAGNOSIS — E7800 Pure hypercholesterolemia, unspecified: Secondary | ICD-10-CM | POA: Diagnosis not present

## 2019-04-23 DIAGNOSIS — I10 Essential (primary) hypertension: Secondary | ICD-10-CM | POA: Diagnosis not present

## 2019-04-23 DIAGNOSIS — Z636 Dependent relative needing care at home: Secondary | ICD-10-CM | POA: Diagnosis not present

## 2019-04-23 DIAGNOSIS — N400 Enlarged prostate without lower urinary tract symptoms: Secondary | ICD-10-CM | POA: Diagnosis not present

## 2019-04-30 ENCOUNTER — Ambulatory Visit (INDEPENDENT_AMBULATORY_CARE_PROVIDER_SITE_OTHER): Payer: Medicare Other | Admitting: *Deleted

## 2019-04-30 DIAGNOSIS — J309 Allergic rhinitis, unspecified: Secondary | ICD-10-CM

## 2019-05-13 ENCOUNTER — Ambulatory Visit: Payer: Self-pay | Admitting: *Deleted

## 2019-05-19 DIAGNOSIS — L218 Other seborrheic dermatitis: Secondary | ICD-10-CM | POA: Diagnosis not present

## 2019-05-29 ENCOUNTER — Ambulatory Visit (INDEPENDENT_AMBULATORY_CARE_PROVIDER_SITE_OTHER): Payer: Medicare Other

## 2019-05-29 DIAGNOSIS — J309 Allergic rhinitis, unspecified: Secondary | ICD-10-CM | POA: Diagnosis not present

## 2019-06-25 ENCOUNTER — Ambulatory Visit (INDEPENDENT_AMBULATORY_CARE_PROVIDER_SITE_OTHER): Payer: Medicare Other

## 2019-06-25 ENCOUNTER — Other Ambulatory Visit: Payer: Self-pay

## 2019-06-25 DIAGNOSIS — J309 Allergic rhinitis, unspecified: Secondary | ICD-10-CM

## 2019-07-16 DIAGNOSIS — S6392XA Sprain of unspecified part of left wrist and hand, initial encounter: Secondary | ICD-10-CM | POA: Diagnosis not present

## 2019-07-21 ENCOUNTER — Other Ambulatory Visit: Payer: Self-pay

## 2019-07-21 ENCOUNTER — Ambulatory Visit (INDEPENDENT_AMBULATORY_CARE_PROVIDER_SITE_OTHER): Payer: Medicare Other

## 2019-07-21 DIAGNOSIS — J309 Allergic rhinitis, unspecified: Secondary | ICD-10-CM | POA: Diagnosis not present

## 2019-07-31 ENCOUNTER — Other Ambulatory Visit: Payer: Self-pay | Admitting: Interventional Cardiology

## 2019-08-03 ENCOUNTER — Other Ambulatory Visit: Payer: Self-pay | Admitting: *Deleted

## 2019-08-03 DIAGNOSIS — R062 Wheezing: Secondary | ICD-10-CM

## 2019-08-03 DIAGNOSIS — R059 Cough, unspecified: Secondary | ICD-10-CM

## 2019-08-03 DIAGNOSIS — R05 Cough: Secondary | ICD-10-CM

## 2019-08-03 MED ORDER — BECLOMETHASONE DIPROPIONATE 80 MCG/ACT IN AERS: 2.0000 | INHALATION_SPRAY | Freq: Two times a day (BID) | RESPIRATORY_TRACT | 5 refills | Status: DC

## 2019-08-13 ENCOUNTER — Telehealth: Payer: Self-pay | Admitting: Interventional Cardiology

## 2019-08-13 DIAGNOSIS — E7849 Other hyperlipidemia: Secondary | ICD-10-CM

## 2019-08-13 DIAGNOSIS — I25810 Atherosclerosis of coronary artery bypass graft(s) without angina pectoris: Secondary | ICD-10-CM

## 2019-08-13 NOTE — Telephone Encounter (Signed)
New Message  Patient would like to have his blood work done and resulted prior to his appointment in January. There is no order for any blood work on the patient's chart. Please assist with added orders for blood work and scheduling prior to appointment.

## 2019-08-13 NOTE — Telephone Encounter (Signed)
Order placed for fasting labs and BMET.  Pt hasn't had checked in over a year and takes statin medication.  Spoke with pt and scheduled labs for 1/7.  Pt verbalized understanding and was in agreement with plan.

## 2019-08-25 ENCOUNTER — Ambulatory Visit (INDEPENDENT_AMBULATORY_CARE_PROVIDER_SITE_OTHER): Payer: Medicare Other

## 2019-08-25 ENCOUNTER — Other Ambulatory Visit: Payer: Self-pay

## 2019-08-25 DIAGNOSIS — J309 Allergic rhinitis, unspecified: Secondary | ICD-10-CM | POA: Diagnosis not present

## 2019-09-08 DIAGNOSIS — H40013 Open angle with borderline findings, low risk, bilateral: Secondary | ICD-10-CM | POA: Diagnosis not present

## 2019-09-08 DIAGNOSIS — E119 Type 2 diabetes mellitus without complications: Secondary | ICD-10-CM | POA: Diagnosis not present

## 2019-09-08 DIAGNOSIS — H524 Presbyopia: Secondary | ICD-10-CM | POA: Diagnosis not present

## 2019-09-08 DIAGNOSIS — H25813 Combined forms of age-related cataract, bilateral: Secondary | ICD-10-CM | POA: Diagnosis not present

## 2019-09-29 ENCOUNTER — Ambulatory Visit (INDEPENDENT_AMBULATORY_CARE_PROVIDER_SITE_OTHER): Payer: Medicare Other

## 2019-09-29 ENCOUNTER — Other Ambulatory Visit: Payer: Self-pay

## 2019-09-29 DIAGNOSIS — J309 Allergic rhinitis, unspecified: Secondary | ICD-10-CM

## 2019-10-08 DIAGNOSIS — J3089 Other allergic rhinitis: Secondary | ICD-10-CM | POA: Diagnosis not present

## 2019-10-08 NOTE — Progress Notes (Signed)
VIAL EXP 10-07-20

## 2019-10-16 ENCOUNTER — Encounter: Payer: Self-pay | Admitting: Allergy

## 2019-10-16 ENCOUNTER — Other Ambulatory Visit: Payer: Self-pay

## 2019-10-16 ENCOUNTER — Ambulatory Visit (INDEPENDENT_AMBULATORY_CARE_PROVIDER_SITE_OTHER): Payer: Medicare Other | Admitting: Allergy

## 2019-10-16 VITALS — BP 170/82 | HR 58 | Temp 97.8°F | Resp 16 | Ht 67.5 in | Wt 176.8 lb

## 2019-10-16 DIAGNOSIS — H1013 Acute atopic conjunctivitis, bilateral: Secondary | ICD-10-CM | POA: Diagnosis not present

## 2019-10-16 DIAGNOSIS — I25810 Atherosclerosis of coronary artery bypass graft(s) without angina pectoris: Secondary | ICD-10-CM

## 2019-10-16 DIAGNOSIS — J4520 Mild intermittent asthma, uncomplicated: Secondary | ICD-10-CM | POA: Diagnosis not present

## 2019-10-16 DIAGNOSIS — J309 Allergic rhinitis, unspecified: Secondary | ICD-10-CM

## 2019-10-16 DIAGNOSIS — R062 Wheezing: Secondary | ICD-10-CM | POA: Insufficient documentation

## 2019-10-16 DIAGNOSIS — H1010 Acute atopic conjunctivitis, unspecified eye: Secondary | ICD-10-CM

## 2019-10-16 MED ORDER — ALBUTEROL SULFATE HFA 108 (90 BASE) MCG/ACT IN AERS: 2.0000 | INHALATION_SPRAY | RESPIRATORY_TRACT | 1 refills | Status: DC | PRN

## 2019-10-16 MED ORDER — MONTELUKAST SODIUM 10 MG PO TABS: 10.0000 mg | ORAL_TABLET | Freq: Every day | ORAL | 5 refills | Status: DC

## 2019-10-16 MED ORDER — AZELASTINE-FLUTICASONE 137-50 MCG/ACT NA SUSP: NASAL | 5 refills | Status: DC

## 2019-10-16 MED ORDER — EPINEPHRINE 0.3 MG/0.3ML IJ SOAJ: 0.3000 mg | Freq: Once | INTRAMUSCULAR | 2 refills | Status: AC

## 2019-10-16 MED ORDER — BECLOMETHASONE DIPROPIONATE 80 MCG/ACT IN AERS: 2.0000 | INHALATION_SPRAY | Freq: Two times a day (BID) | RESPIRATORY_TRACT | 5 refills | Status: DC

## 2019-10-16 NOTE — Progress Notes (Signed)
[place] [address] Dept: [phone]  FOLLOW UP NOTE  Patient ID: [name] [name], male    DOB: 1941-10-26  Age: 78 y.o. MRN: [id number] Date of Office Visit: 10/16/2019  Assessment  Chief Complaint: Asthma (no issues) and Allergic Rhinitis  (doing okay, asking for montelukast)  HPI [name] [name] is a 78 year old ,male who presents to the clinic for a follow up visit. He was last seen in this clinic on 10/01/2018 By. Dr. Nelva Bush for evaluation of asthma, allergic rhinitis, and allergic conjunctivitis. At today's visit, he reports his asthma has been well controlled with no shortness of breath, cough, or wheeze with activity or rest. He reports that he begins to experience symptoms beginning in late summer and lasting through October. He has not taken montelukast 10 mg, used Quvar or used his albuterol inhaler since October. Allergic rhinitis is reported as well controlled with occasional use of Dymista as needed with relief of symptoms. Allergic conjunctivitis is reported as well controlled with no medical intervention at this time He continues allergen immunotherapy with no adverse effects. He reports a significant decrease in his symptoms while continuing on allergen immunotherapy. His current medications are lsited in the chart.    Drug Allergies:  Allergies  Allergen Reactions  . Sulfa Antibiotics Rash       . Sulfur Rash    Physical Exam: BP (!) 170/82 (BP Location: Left Arm, Patient Position: Sitting, Cuff Size: Normal)   Pulse (!) 58   Temp 97.8 F (36.6 C) (Temporal)   Resp 16   Ht 5' 7.5" (1.715 m)   Wt 176 lb 12.8 oz (80.2 kg)   SpO2 98%   BMI 27.28 kg/m    Physical Exam Vitals reviewed.  Constitutional:      Appearance: Normal appearance.  HENT:     Head: Normocephalic and atraumatic.     Right Ear: Tympanic membrane normal.     Left Ear: Tympanic membrane normal.     Nose:     Comments: Bilateral nares normal. Pharynx normal. Ears  normal. Eyes normal.    Mouth/Throat:     Pharynx: Oropharynx is clear.  Eyes:     Conjunctiva/sclera: Conjunctivae normal.  Cardiovascular:     Rate and Rhythm: Normal rate and regular rhythm.     Heart sounds: Normal heart sounds. No murmur.  Pulmonary:     Effort: Pulmonary effort is normal.     Breath sounds: Normal breath sounds.     Comments: Lungs clear to auscultaiton Musculoskeletal:        General: Normal range of motion.     Cervical back: Normal range of motion and neck supple.  Skin:    General: Skin is warm and dry.  Neurological:     Mental Status: He is alert and oriented to person, place, and time.  Psychiatric:        Mood and Affect: Mood normal.        Behavior: Behavior normal.        Thought Content: Thought content normal.        Judgment: Judgment normal.     Diagnostics: FVC 3.32.  FEV1 2.51.  Predicted FVC 3.45.  Predicted FEV1 2.54.  Spirometry indicates normal ventilatory function.  Assessment and Plan: 1. Mild intermittent asthma without complication   2. Allergic rhinoconjunctivitis   3. Allergic conjunctivitis of both eyes     Meds ordered this encounter  Medications  . EPINEPHrine (EPIPEN 2-PAK) 0.3  mg/0.3 mL IJ SOAJ injection    Sig: Inject 0.3 mLs (0.3 mg total) into the muscle once for 1 dose.    Dispense:  2 each    Refill:  2  . albuterol (VENTOLIN HFA) 108 (90 Base) MCG/ACT inhaler    Sig: Inhale 2 puffs into the lungs every 4 (four) hours as needed.    Dispense:  18 g    Refill:  1  . Azelastine-Fluticasone (DYMISTA) 137-50 MCG/ACT SUSP    Sig: PLACE 1 SPRAY INTO THE NOSE 2 (TWO) TIMES DAILY.    Dispense:  23 g    Refill:  5  . beclomethasone (QVAR) 80 MCG/ACT inhaler    Sig: Inhale 2 puffs into the lungs 2 (two) times daily.    Dispense:  1 Inhaler    Refill:  5    To prevent cough or wheeze. Rinse, gargle, and spit out after use  . montelukast (SINGULAIR) 10 MG tablet    Sig: Take 1 tablet (10 mg total) by mouth at  bedtime.    Dispense:  30 tablet    Refill:  5    Patient Instructions  Mild persistent asthma - Well-controlled - Continue albuterol 2 puffs every 4-6 hours as needed for cough, wheeze, shortness of breath - Asthma action plan: Start Qvar 80 g 2 puffs twice a day and Singulair 10mg  daily during respiratory illness/asthma exacerbation Asthma control goals:   Full participation in all desired activities (may need albuterol before activity)  Albuterol use two time or less a week on average (not counting use with activity)  Cough interfering with sleep two time or less a month  Oral steroids no more than once a year  No hospitalizations  Allergic rhinoconjunctivitis - Continue avoidance: Dust Mite and Mold.       - Claritin 10mg  by mouth once daily for runny nose or itching as needed. - Continue Dymista 1-2 spray(s) each nostril daily for stuffy nose or drainage. --Refill today - Continue monthly allergen immunotherapy        -Epi-pen/Benadryl as needed.   Follow up Visit: 6 months or sooner if needed.   Return in about 6 months (around 04/15/2020), or if symptoms worsen or fail to improve.    Thank you for the opportunity to care for this patient.  Please do not hesitate to contact me with questions.  Gareth Morgan, FNP [facility]

## 2019-10-16 NOTE — Patient Instructions (Addendum)
Mild persistent asthma - Well-controlled - Continue albuterol 2 puffs every 4-6 hours as needed for cough, wheeze, shortness of breath - Asthma action plan: Start Qvar 80 g 2 puffs twice a day and Singulair 10mg  daily during respiratory illness/asthma exacerbation Asthma control goals:   Full participation in all desired activities (may need albuterol before activity)  Albuterol use two time or less a week on average (not counting use with activity)  Cough interfering with sleep two time or less a month  Oral steroids no more than once a year  No hospitalizations  Allergic rhinoconjunctivitis - Continue avoidance: Dust Mite and Mold.       - Claritin 10mg  by mouth once daily for runny nose or itching as needed. - Continue Dymista 1-2 spray(s) each nostril daily for stuffy nose or drainage. --Refill today - Continue monthly allergen immunotherapy        -Epi-pen/Benadryl as needed.   Follow up Visit: 6 months or sooner if needed.  Reducing Pollen Exposure The American Academy of Allergy, Asthma and Immunology suggests the following steps to reduce your exposure to pollen during allergy seasons. 1. Do not hang sheets or clothing out to dry; pollen may collect on these items. 2. Do not mow lawns or spend time around freshly cut grass; mowing stirs up pollen. 3. Keep windows closed at night.  Keep car windows closed while driving. 4. Minimize morning activities outdoors, a time when pollen counts are usually at their highest. 5. Stay indoors as much as possible when pollen counts or humidity is high and on windy days when pollen tends to remain in the air longer. 6. Use air conditioning when possible.  Many air conditioners have filters that trap the pollen spores. 7. Use a HEPA room air filter to remove pollen form the indoor air you breathe.  Control of Mold Allergen Mold and fungi can grow on a variety of surfaces provided certain temperature and moisture conditions exist.   Outdoor molds grow on plants, decaying vegetation and soil.  The major outdoor mold, Alternaria and Cladosporium, are found in very high numbers during hot and dry conditions.  Generally, a late Summer - Fall peak is seen for common outdoor fungal spores.  Rain will temporarily lower outdoor mold spore count, but counts rise rapidly when the rainy period ends.  The most important indoor molds are Aspergillus and Penicillium.  Dark, humid and poorly ventilated basements are ideal sites for mold growth.  The next most common sites of mold growth are the bathroom and the kitchen.  Outdoor [employer] 8. Use air conditioning and keep windows closed 9. Avoid exposure to decaying vegetation. 10. Avoid leaf raking. 11. Avoid grain handling. 12. Consider wearing a face mask if working in moldy areas.  Indoor Mold Control 1. Maintain humidity below 50%. 2. Clean washable surfaces with 5% bleach solution. 3. Remove sources e.g. Contaminated carpets.   Control of Dust Mite Allergen Dust mites play a major role in allergic asthma and rhinitis. They occur in environments with high humidity wherever human skin is found. Dust mites absorb humidity from the atmosphere (ie, they do not drink) and feed on organic matter (including shed human and animal skin). Dust mites are a microscopic type of insect that you cannot see with the naked eye. High levels of dust mites have been detected from mattresses, pillows, carpets, upholstered furniture, bed covers, clothes, soft toys and any woven material. The principal allergen of the dust mite is found in its feces.  A gram of dust may contain 1,000 mites and 250,000 fecal particles. Mite antigen is easily measured in the air during house cleaning activities. Dust mites do not bite and do not cause harm to humans, other than by triggering allergies/asthma.  Ways to decrease your exposure to dust mites in your home:  1. Encase mattresses, box springs and pillows with a  mite-impermeable barrier or cover  2. Wash sheets, blankets and drapes weekly in hot water (130 F) with detergent and dry them in a dryer on the hot setting.  3. Have the room cleaned frequently with a vacuum cleaner and a damp dust-mop. For carpeting or rugs, vacuuming with a vacuum cleaner equipped with a high-efficiency particulate air (HEPA) filter. The dust mite allergic individual should not be in a room which is being cleaned and should wait 1 hour after cleaning before going into the room.  4. Do not sleep on upholstered furniture (eg, couches).  5. If possible removing carpeting, upholstered furniture and drapery from the home is ideal. Horizontal blinds should be eliminated in the rooms where the person spends the most time (bedroom, study, television room). Washable vinyl, roller-type shades are optimal.  6. Remove all non-washable stuffed toys from the bedroom. Wash stuffed toys weekly like sheets and blankets above.  7. Reduce indoor humidity to less than 50%. Inexpensive humidity monitors can be purchased at most hardware stores. Do not use a humidifier as can make the problem worse and are not recommended.

## 2019-11-03 ENCOUNTER — Other Ambulatory Visit: Payer: Self-pay

## 2019-11-03 ENCOUNTER — Ambulatory Visit (INDEPENDENT_AMBULATORY_CARE_PROVIDER_SITE_OTHER): Payer: Medicare Other

## 2019-11-03 DIAGNOSIS — J309 Allergic rhinitis, unspecified: Secondary | ICD-10-CM

## 2019-11-05 ENCOUNTER — Other Ambulatory Visit: Payer: Medicare Other | Admitting: *Deleted

## 2019-11-05 ENCOUNTER — Other Ambulatory Visit: Payer: Self-pay

## 2019-11-05 DIAGNOSIS — E7849 Other hyperlipidemia: Secondary | ICD-10-CM

## 2019-11-05 DIAGNOSIS — I25810 Atherosclerosis of coronary artery bypass graft(s) without angina pectoris: Secondary | ICD-10-CM | POA: Diagnosis not present

## 2019-11-05 LAB — BASIC METABOLIC PANEL
BUN/Creatinine Ratio: 16 (ref 10–24)
BUN: 13 mg/dL (ref 8–27)
CO2: 26 mmol/L (ref 20–29)
Calcium: 9.3 mg/dL (ref 8.6–10.2)
Chloride: 97 mmol/L (ref 96–106)
Creatinine, Ser: 0.8 mg/dL (ref 0.76–1.27)
GFR calc Af Amer: 100 mL/min/{1.73_m2} (ref >59)
GFR calc non Af Amer: 87 mL/min/{1.73_m2} (ref >59)
Glucose: 156 mg/dL — ABNORMAL HIGH (ref 65–99)
Potassium: 4 mmol/L (ref 3.5–5.2)
Sodium: 137 mmol/L (ref 134–144)

## 2019-11-05 LAB — HEPATIC FUNCTION PANEL
ALT: 47 IU/L — ABNORMAL HIGH (ref 0–44)
AST: 38 IU/L (ref 0–40)
Albumin: 4.6 g/dL (ref 3.7–4.7)
Alkaline Phosphatase: 44 IU/L (ref 39–117)
Bilirubin Total: 0.8 mg/dL (ref 0.0–1.2)
Bilirubin, Direct: 0.22 mg/dL (ref 0.00–0.40)
Total Protein: 7.3 g/dL (ref 6.0–8.5)

## 2019-11-05 LAB — LIPID PANEL
Chol/HDL Ratio: 2.8 ratio (ref 0.0–5.0)
Cholesterol, Total: 126 mg/dL (ref 100–199)
HDL: 45 mg/dL (ref >39)
LDL Chol Calc (NIH): 58 mg/dL (ref 0–99)
Triglycerides: 132 mg/dL (ref 0–149)
VLDL Cholesterol Cal: 23 mg/dL (ref 5–40)

## 2019-11-09 DIAGNOSIS — E1169 Type 2 diabetes mellitus with other specified complication: Secondary | ICD-10-CM | POA: Diagnosis not present

## 2019-11-09 DIAGNOSIS — Z7984 Long term (current) use of oral hypoglycemic drugs: Secondary | ICD-10-CM | POA: Diagnosis not present

## 2019-11-09 NOTE — Progress Notes (Signed)
Cardiology Office Note:    Date:  11/10/2019   ID:  [name] [name], DOB November 29, 1940, MRN [id number]  PCP:  Lavone Orn, MD  Cardiologist:  Sinclair Grooms, MD   Referring MD: Lavone Orn, MD   Chief Complaint  Patient presents with  . Coronary Artery Disease    History of Present Illness:    [name] [name] is a 79 y.o. male with a hx of CABG 2008, hypertension, DM2, and hyperlipidemia.  He is doing relatively well.  Decreased exertional activities due to restrictions imposed by COVID-19 pandemic.  He denies angina.  No orthopnea or PND.  Some feelings of depression related to restrictions and also due to his wife developing dementia.  Past Medical History:  Diagnosis Date  . Allergic rhinitis    Immunotherapy, [place]  . BPH (benign prostatic hyperplasia)   . CAD (coronary artery disease)    CABG in 2008 with LIMA to LAD, SVG to Diag., SVG to OM1 and 2, SVG to PDA and PLOM, angioplasty x2 in 1990s, Dr. Tamala Julian.  . Constipation   . Coronary atherosclerosis of native coronary artery   . Diabetes mellitus type 2, uncontrolled ([place]) 11/2012  . ED (erectile dysfunction)   . Essential hypertension, benign    Good control  . Hemorrhoids   . Hyperlipidemia    Good control  . IBS (irritable bowel syndrome)    Hx of, with constipation    Past Surgical History:  Procedure Laterality Date  . CORONARY ANGIOPLASTY WITH STENT PLACEMENT  1990s   Angioplasty x2 in 1990s, Dr. Tamala Julian.  . CORONARY ARTERY BYPASS GRAFT  2008   With LIMA to LAD, SVG to Diag., SVG to OM1 and 2, SVG to PDA and PLOM, angioplasty x2 in 1990s, Dr. Tamala Julian.  . TONSILLECTOMY     teenager    Current Medications: Current Meds  Medication Sig  . albuterol (VENTOLIN HFA) 108 (90 Base) MCG/ACT inhaler Inhale 2 puffs into the lungs every 4 (four) hours as needed.  Marland Kitchen aspirin 81 MG tablet Take 81 mg by mouth daily.   Marland Kitchen atorvastatin (LIPITOR) 40 MG tablet Take 1 tablet (40 mg total) by mouth daily.  .  Azelastine-Fluticasone (DYMISTA) 137-50 MCG/ACT SUSP PLACE 1 SPRAY INTO THE NOSE 2 (TWO) TIMES DAILY.  . beclomethasone (QVAR) 80 MCG/ACT inhaler Inhale 2 puffs into the lungs 2 (two) times daily.  . clobetasol (TEMOVATE) 0.05 % external solution Apply 1 application topically daily as needed (DANRUFF).   Marland Kitchen EPINEPHrine 0.3 mg/0.3 mL IJ SOAJ injection INJECT 0.3 MLS (0.3 MG TOTAL) INTO THE MUSCLE ONCE FOR 1 DOSE.  Marland Kitchen glucose blood test strip 1 each by Other route as needed for other (VERIGOLD). Use as instructed  . indapamide (LOZOL) 1.25 MG tablet TAKE 1 TABLET BY MOUTH EVERY DAY  . KLOR-CON M20 20 MEQ tablet   . Lancets MISC by Does not apply route.  . metoprolol succinate (TOPROL-XL) 50 MG 24 hr tablet TAKE 1 TABLET BY MOUTH DAILY WITH OR IMMEDIATELY FOLLOWING A MEAL  . montelukast (SINGULAIR) 10 MG tablet Take 1 tablet (10 mg total) by mouth at bedtime.  . Multiple Vitamins-Minerals (MULTIVITAMIN PO) Take 1 tablet by mouth daily.   . nitroGLYCERIN (NITROSTAT) 0.4 MG SL tablet Place 0.4 mg under the tongue every 5 (five) minutes as needed for chest pain.  Marland Kitchen OVER THE COUNTER MEDICATION Take 1 tablet by mouth 2 (two) times daily. (SUPER BETA PROSTATE)  . pravastatin (PRAVACHOL) 40 MG tablet Take 40  mg by mouth daily.  . valsartan (DIOVAN) 320 MG tablet Take 1 tablet (320 mg total) by mouth daily.     Allergies:   Sulfa antibiotics and Sulfur   Social History   Socioeconomic History  . Marital status: Married    Spouse name: Not on file  . Number of children: Not on file  . Years of education: Not on file  . Highest education level: Not on file  Occupational History  . Not on file  Tobacco Use  . Smoking status: Former Smoker    Types: Cigarettes    Quit date: 10/07/1967    Years since quitting: 52.1  . Smokeless tobacco: Never Used  Substance and Sexual Activity  . Alcohol use: Yes    Comment: wine with evening meal occasionally  . Drug use: No  . Sexual activity: Not Currently    Other Topics Concern  . Not on file  Social History Narrative  . Not on file   Social Determinants of Health   Financial Resource Strain:   . Difficulty of Paying Living Expenses: Not on file  Food Insecurity:   . Worried About Charity fundraiser in the Last Year: Not on file  . Ran Out of Food in the Last Year: Not on file  Transportation Needs:   . Lack of Transportation (Medical): Not on file  . Lack of Transportation (Non-Medical): Not on file  Physical Activity:   . Days of Exercise per Week: Not on file  . Minutes of Exercise per Session: Not on file  Stress:   . Feeling of Stress : Not on file  Social Connections:   . Frequency of Communication with Friends and Family: Not on file  . Frequency of Social Gatherings with Friends and Family: Not on file  . Attends Religious Services: Not on file  . Active Member of Clubs or Organizations: Not on file  . Attends Archivist Meetings: Not on file  . Marital Status: Not on file     Family History: The patient's family history includes Asthma in his mother; CAD in his father; Colon cancer in his maternal aunt; Heart attack in his father.  ROS:   Please see the history of present illness.    He spends 24/7 caring for his wife.  She is ambulatory but at risk if left alone.  This is restricted physical activity.  All other systems reviewed and are negative.  EKGs/Labs/Other Studies Reviewed:    The following studies were reviewed today: No new cardiac data  EKG:  EKG sinus bradycardia, 57 bpm, prominent voltage, nonspecific T wave flattening.  Recent Labs: 11/05/2019: ALT 47; BUN 13; Creatinine, Ser 0.80; Potassium 4.0; Sodium 137  Recent Lipid Panel    Component Value Date/Time   CHOL 126 11/05/2019 0912   TRIG 132 11/05/2019 0912   HDL 45 11/05/2019 0912   CHOLHDL 2.8 11/05/2019 0912   CHOLHDL 2.7 09/26/2016 1015   VLDL 23 09/26/2016 1015   LDLCALC 58 11/05/2019 0912    Physical Exam:    VS:  BP  138/76   Pulse (!) 57   Ht 5\' 7"  (1.702 m)   Wt 175 lb 12.8 oz (79.7 kg)   SpO2 98%   BMI 27.53 kg/m     Wt Readings from Last 3 Encounters:  11/10/19 175 lb 12.8 oz (79.7 kg)  10/16/19 176 lb 12.8 oz (80.2 kg)  10/01/18 172 lb 9.6 oz (78.3 kg)     GEN: Appears  younger than stated age. No acute distress HEENT: Normal NECK: No JVD. LYMPHATICS: No lymphadenopathy CARDIAC:  RRR without murmur, gallop, or edema. VASCULAR:  Normal Pulses. No bruits. RESPIRATORY:  Clear to auscultation without rales, wheezing or rhonchi  ABDOMEN: Soft, non-tender, non-distended, No pulsatile mass, MUSCULOSKELETAL: No deformity  SKIN: Warm and dry NEUROLOGIC:  Alert and oriented x 3 PSYCHIATRIC:  Normal affect   ASSESSMENT:    1. Coronary artery disease involving coronary bypass graft of native heart without angina pectoris   2. Other hyperlipidemia   3. Essential hypertension   4. Educated about COVID-19 virus infection    PLAN:    In order of problems listed above:  1. Secondary prevention discussed in detail. 2. LDL is at target. 3. Blood pressure is at target. 4. 3W's and Covid vaccine advocated, and will be acted upon by the patient.  Overall education and awareness concerning primary/secondary risk prevention was discussed in detail: LDL less than 70, hemoglobin A1c less than 7, blood pressure target less than 130/80 mmHg, >150 minutes of moderate aerobic activity per week, avoidance of smoking, weight control (via diet and exercise), and continued surveillance/management of/for obstructive sleep apnea.    Medication Adjustments/Labs and Tests Ordered: Current medicines are reviewed at length with the patient today.  Concerns regarding medicines are outlined above.  Orders Placed This Encounter  Procedures  . EKG 12-Lead   No orders of the defined types were placed in this encounter.   There are no Patient Instructions on file for this visit.   Signed, Sinclair Grooms, MD    11/10/2019 11:07 AM    [place]

## 2019-11-10 ENCOUNTER — Ambulatory Visit (INDEPENDENT_AMBULATORY_CARE_PROVIDER_SITE_OTHER): Payer: Medicare Other | Admitting: Interventional Cardiology

## 2019-11-10 ENCOUNTER — Encounter: Payer: Self-pay | Admitting: Interventional Cardiology

## 2019-11-10 ENCOUNTER — Other Ambulatory Visit: Payer: Self-pay

## 2019-11-10 VITALS — BP 138/76 | HR 57 | Ht 67.0 in | Wt 175.8 lb

## 2019-11-10 DIAGNOSIS — E7849 Other hyperlipidemia: Secondary | ICD-10-CM

## 2019-11-10 DIAGNOSIS — I25810 Atherosclerosis of coronary artery bypass graft(s) without angina pectoris: Secondary | ICD-10-CM | POA: Diagnosis not present

## 2019-11-10 DIAGNOSIS — Z7189 Other specified counseling: Secondary | ICD-10-CM

## 2019-11-10 DIAGNOSIS — I10 Essential (primary) hypertension: Secondary | ICD-10-CM | POA: Diagnosis not present

## 2019-11-10 NOTE — Patient Instructions (Signed)

## 2019-11-22 ENCOUNTER — Other Ambulatory Visit: Payer: Self-pay | Admitting: Interventional Cardiology

## 2019-11-26 DIAGNOSIS — E1169 Type 2 diabetes mellitus with other specified complication: Secondary | ICD-10-CM | POA: Diagnosis not present

## 2019-11-26 DIAGNOSIS — F418 Other specified anxiety disorders: Secondary | ICD-10-CM | POA: Diagnosis not present

## 2019-12-01 ENCOUNTER — Other Ambulatory Visit: Payer: Self-pay

## 2019-12-01 ENCOUNTER — Ambulatory Visit (INDEPENDENT_AMBULATORY_CARE_PROVIDER_SITE_OTHER): Payer: Medicare Other

## 2019-12-01 ENCOUNTER — Other Ambulatory Visit: Payer: Self-pay | Admitting: Family Medicine

## 2019-12-01 DIAGNOSIS — J4520 Mild intermittent asthma, uncomplicated: Secondary | ICD-10-CM

## 2019-12-01 DIAGNOSIS — J309 Allergic rhinitis, unspecified: Secondary | ICD-10-CM | POA: Diagnosis not present

## 2019-12-02 ENCOUNTER — Telehealth: Payer: Self-pay | Admitting: Interventional Cardiology

## 2019-12-02 NOTE — Telephone Encounter (Signed)
New Message  We are recommending the COVID-19 vaccine to all of our patients. Cardiac medications (including blood thinners) should not deter anyone from being vaccinated and there is no need to hold any of those medications prior to vaccine administration.     Currently, there is a hotline to call (active 11/06/19) to schedule vaccination appointments as no walk-ins will be accepted.   Number: [phone].    If an appointment is not available please go to [email or web address] to sign up for notification when additional vaccine appointments are available.   If you have further questions or concerns about the vaccine process, please visit [email or web address] or contact your primary care physician.   

## 2019-12-10 ENCOUNTER — Other Ambulatory Visit: Payer: Self-pay

## 2019-12-10 ENCOUNTER — Ambulatory Visit (INDEPENDENT_AMBULATORY_CARE_PROVIDER_SITE_OTHER): Payer: Medicare Other

## 2019-12-10 DIAGNOSIS — J309 Allergic rhinitis, unspecified: Secondary | ICD-10-CM

## 2019-12-24 ENCOUNTER — Other Ambulatory Visit: Payer: Self-pay

## 2019-12-24 ENCOUNTER — Ambulatory Visit (INDEPENDENT_AMBULATORY_CARE_PROVIDER_SITE_OTHER): Payer: Medicare Other

## 2019-12-24 DIAGNOSIS — J309 Allergic rhinitis, unspecified: Secondary | ICD-10-CM

## 2019-12-31 ENCOUNTER — Other Ambulatory Visit: Payer: Self-pay

## 2019-12-31 ENCOUNTER — Ambulatory Visit (INDEPENDENT_AMBULATORY_CARE_PROVIDER_SITE_OTHER): Payer: Medicare Other

## 2019-12-31 DIAGNOSIS — J309 Allergic rhinitis, unspecified: Secondary | ICD-10-CM

## 2020-01-07 ENCOUNTER — Other Ambulatory Visit: Payer: Self-pay

## 2020-01-07 ENCOUNTER — Ambulatory Visit (INDEPENDENT_AMBULATORY_CARE_PROVIDER_SITE_OTHER): Payer: Medicare Other

## 2020-01-07 DIAGNOSIS — J309 Allergic rhinitis, unspecified: Secondary | ICD-10-CM

## 2020-01-19 ENCOUNTER — Other Ambulatory Visit: Payer: Self-pay

## 2020-01-19 ENCOUNTER — Ambulatory Visit (INDEPENDENT_AMBULATORY_CARE_PROVIDER_SITE_OTHER): Payer: Medicare Other

## 2020-01-19 DIAGNOSIS — J309 Allergic rhinitis, unspecified: Secondary | ICD-10-CM | POA: Diagnosis not present

## 2020-02-10 DIAGNOSIS — E1169 Type 2 diabetes mellitus with other specified complication: Secondary | ICD-10-CM | POA: Diagnosis not present

## 2020-02-10 DIAGNOSIS — Z7984 Long term (current) use of oral hypoglycemic drugs: Secondary | ICD-10-CM | POA: Diagnosis not present

## 2020-02-18 ENCOUNTER — Ambulatory Visit (INDEPENDENT_AMBULATORY_CARE_PROVIDER_SITE_OTHER): Payer: Medicare Other

## 2020-02-18 ENCOUNTER — Other Ambulatory Visit: Payer: Self-pay

## 2020-02-18 DIAGNOSIS — J309 Allergic rhinitis, unspecified: Secondary | ICD-10-CM

## 2020-03-17 ENCOUNTER — Other Ambulatory Visit: Payer: Self-pay

## 2020-03-17 ENCOUNTER — Ambulatory Visit (INDEPENDENT_AMBULATORY_CARE_PROVIDER_SITE_OTHER): Payer: Medicare Other

## 2020-03-17 DIAGNOSIS — J309 Allergic rhinitis, unspecified: Secondary | ICD-10-CM | POA: Diagnosis not present

## 2020-04-12 ENCOUNTER — Other Ambulatory Visit: Payer: Self-pay

## 2020-04-12 ENCOUNTER — Ambulatory Visit (INDEPENDENT_AMBULATORY_CARE_PROVIDER_SITE_OTHER): Payer: Medicare Other

## 2020-04-12 DIAGNOSIS — Z0000 Encounter for general adult medical examination without abnormal findings: Secondary | ICD-10-CM | POA: Diagnosis not present

## 2020-04-12 DIAGNOSIS — J309 Allergic rhinitis, unspecified: Secondary | ICD-10-CM

## 2020-04-12 DIAGNOSIS — Z1389 Encounter for screening for other disorder: Secondary | ICD-10-CM | POA: Diagnosis not present

## 2020-04-15 ENCOUNTER — Ambulatory Visit: Payer: Medicare Other | Admitting: Allergy

## 2020-05-17 ENCOUNTER — Other Ambulatory Visit: Payer: Self-pay

## 2020-05-17 ENCOUNTER — Ambulatory Visit (INDEPENDENT_AMBULATORY_CARE_PROVIDER_SITE_OTHER): Payer: Medicare Other

## 2020-05-17 DIAGNOSIS — J309 Allergic rhinitis, unspecified: Secondary | ICD-10-CM | POA: Diagnosis not present

## 2020-05-18 DIAGNOSIS — J3089 Other allergic rhinitis: Secondary | ICD-10-CM | POA: Diagnosis not present

## 2020-05-18 NOTE — Progress Notes (Signed)
Vial exp 05-18-21

## 2020-05-19 ENCOUNTER — Ambulatory Visit (INDEPENDENT_AMBULATORY_CARE_PROVIDER_SITE_OTHER): Payer: Medicare Other | Admitting: Allergy

## 2020-05-19 ENCOUNTER — Encounter: Payer: Self-pay | Admitting: Allergy

## 2020-05-19 ENCOUNTER — Other Ambulatory Visit: Payer: Self-pay

## 2020-05-19 VITALS — BP 132/66 | HR 58 | Temp 98.0°F | Resp 18 | Ht 67.0 in | Wt 169.4 lb

## 2020-05-19 DIAGNOSIS — H1013 Acute atopic conjunctivitis, bilateral: Secondary | ICD-10-CM

## 2020-05-19 DIAGNOSIS — J3089 Other allergic rhinitis: Secondary | ICD-10-CM | POA: Diagnosis not present

## 2020-05-19 DIAGNOSIS — J4520 Mild intermittent asthma, uncomplicated: Secondary | ICD-10-CM

## 2020-05-19 DIAGNOSIS — I25810 Atherosclerosis of coronary artery bypass graft(s) without angina pectoris: Secondary | ICD-10-CM | POA: Diagnosis not present

## 2020-05-19 NOTE — Patient Instructions (Addendum)
Mild persistent asthma - Well-controlled - Continue Singulair 10mg  at bedtime - Continue albuterol 2 puffs every 4-6 hours as needed for cough, wheeze, shortness of breath - Asthma action plan: Start Qvar 80 g 2 puffs twice a day during respiratory illness/asthma exacerbation Asthma control goals:   Full participation in all desired activities (may need albuterol before activity)  Albuterol use two time or less a week on average (not counting use with activity)  Cough interfering with sleep two time or less a month  Oral steroids no more than once a year  No hospitalizations  Allergic rhinoconjunctivitis - Continue avoidance: Weeds, Dust Mite and Mold.       - Claritin 10mg  by mouth once daily for runny nose or itching as needed. - Continue Dymista 1-2 spray(s) each nostril daily for stuffy nose or drainage. --Refill today - Continue monthly allergen immunotherapy for now.           -Epi-pen/Benadryl as needed. - You have been a maintenance dosing of your allergen immunotherapy now for 5 years thus you may be able to stop your injections.  Will obtain environmental allergy panel today to see if you have lost your sensitivities and if so then we can attempt to stop injections.    Follow up Visit: 12 months or sooner if needed.

## 2020-05-19 NOTE — Progress Notes (Signed)
Follow-up Note  RE: [name] [name] MRN: [id number] DOB: 12/17/1940 Date of Office Visit: 05/19/2020   History of present illness: [name] [name] is a 79 y.o. male presenting today for follow-up of asthma and allergic rhinitis with conjunctivitis.  He was last seen in the office on 10/16/2019 by our nurse practitioner Gareth Morgan.  He states he has done well over the past 7 months without any major health changes.  He has had both doses of the Pfizer vaccine. With his asthma he states he has not had any symptoms.  Also denies nighttime awakenings.  He has not needed to use his albuterol since the last visit.  He has not had any flares where he needs to initiate the Qvar.  He does continue taking Singulair at night.  He denies any urgent care/ED visits or an systemic steroid needs. With his allergies he states they took well controlled.  He continues on immunotherapy at maintenance monthly dosing.  He states he does not need to use the Claritin.  He does like having access to the [place] as it is effective if he has a runny or stuffy nose.  He has been at maintenance at least since 2016.  Review of systems: Review of Systems  Constitutional: Negative.   HENT: Negative.   Eyes: Negative.   Respiratory: Negative.   Cardiovascular: Negative.   Gastrointestinal: Negative.   Musculoskeletal: Negative.   Skin: Negative.   Neurological: Negative.     All other systems negative unless noted above in HPI  Past medical/social/surgical/family history have been reviewed and are unchanged unless specifically indicated below.  No changes  Medication List: Current Outpatient Medications  Medication Sig Dispense Refill  . albuterol (VENTOLIN HFA) 108 (90 Base) MCG/ACT inhaler TAKE 2 PUFFS BY MOUTH EVERY 4 HOURS AS NEEDED 18 g 1  . aspirin 81 MG tablet Take 81 mg by mouth daily.     Marland Kitchen atorvastatin (LIPITOR) 40 MG tablet Take 1 tablet (40 mg total) by mouth daily. 90 tablet 3  .  Azelastine-Fluticasone (DYMISTA) 137-50 MCG/ACT SUSP PLACE 1 SPRAY INTO THE NOSE 2 (TWO) TIMES DAILY. 23 g 5  . beclomethasone (QVAR) 80 MCG/ACT inhaler Inhale 2 puffs into the lungs 2 (two) times daily. 1 Inhaler 5  . clobetasol (TEMOVATE) 0.05 % external solution Apply 1 application topically daily as needed (DANRUFF).     Marland Kitchen EPINEPHrine 0.3 mg/0.3 mL IJ SOAJ injection INJECT 0.3 MLS (0.3 MG TOTAL) INTO THE MUSCLE ONCE FOR 1 DOSE.    Marland Kitchen glucose blood test strip 1 each by Other route as needed for other (VERIGOLD). Use as instructed    . indapamide (LOZOL) 1.25 MG tablet TAKE 1 TABLET BY MOUTH EVERY DAY 90 tablet 0  . KLOR-CON M20 20 MEQ tablet     . Lancets MISC by Does not apply route.    . metoprolol succinate (TOPROL-XL) 50 MG 24 hr tablet TAKE 1 TABLET BY MOUTH EVERY DAY WITH OR IMMEDIATELY FOLLOWING A MEAL 90 tablet 3  . montelukast (SINGULAIR) 10 MG tablet Take 1 tablet (10 mg total) by mouth at bedtime. 30 tablet 5  . Multiple Vitamins-Minerals (MULTIVITAMIN PO) Take 1 tablet by mouth daily.     . nitroGLYCERIN (NITROSTAT) 0.4 MG SL tablet Place 0.4 mg under the tongue every 5 (five) minutes as needed for chest pain.    . pravastatin (PRAVACHOL) 40 MG tablet Take 40 mg by mouth daily.  3  . valsartan (DIOVAN) 320 MG tablet  Take 1 tablet (320 mg total) by mouth daily. 90 tablet 3  . OVER THE COUNTER MEDICATION Take 1 tablet by mouth 2 (two) times daily. (SUPER BETA PROSTATE)    . sertraline (ZOLOFT) 25 MG tablet Take 25 mg by mouth daily.     No current facility-administered medications for this visit.     Known medication allergies: Allergies  Allergen Reactions  . Sulfa Antibiotics Rash       . Sulfur Rash     Physical examination: Blood pressure (!) 132/66, pulse 58, temperature 98 F (36.7 C), resp. rate 18, height 5\' 7"  (1.702 m), weight 169 lb 6.4 oz (76.8 kg), SpO2 95 %.  General: Alert, interactive, in no acute distress. HEENT: PERRLA, TMs pearly gray, turbinates  non-edematous without discharge, post-pharynx non erythematous. Neck: Supple without lymphadenopathy. Lungs: Clear to auscultation without wheezing, rhonchi or rales. {no increased work of breathing. CV: Normal S1, S2 without murmurs. Abdomen: Nondistended, nontender. Skin: Warm and dry, without lesions or rashes. Extremities:  No clubbing, cyanosis or edema. Neuro:   Grossly intact.  Diagnositics/Labs: None today  Assessment and plan:   Mild persistent asthma - Well-controlled - Continue Singulair 10mg  at bedtime - Continue albuterol 2 puffs every 4-6 hours as needed for cough, wheeze, shortness of breath - Asthma action plan: Start Qvar 80 g 2 puffs twice a day during respiratory illness/asthma exacerbation Asthma control goals:   Full participation in all desired activities (may need albuterol before activity)  Albuterol use two time or less a week on average (not counting use with activity)  Cough interfering with sleep two time or less a month  Oral steroids no more than once a year  No hospitalizations  Allergic rhinoconjunctivitis - Continue avoidance: Weeds, Dust Mite and Mold.       - Claritin 10mg  by mouth once daily for runny nose or itching as needed. - Continue Dymista 1-2 spray(s) each nostril daily for stuffy nose or drainage. --Refill today - Continue monthly allergen immunotherapy for now.           -Epi-pen/Benadryl as needed. - You have been a maintenance dosing of your allergen immunotherapy now for 5 years thus you may be able to stop your injections.  Will obtain environmental allergy panel today to see if you have lost your sensitivities and if so then we can attempt to stop injections.    Follow up Visit: 12 months or sooner if needed.  I appreciate the opportunity to take part in [name] [name]'s care. Please do not hesitate to contact me with questions.  Sincerely,   Prudy Feeler, MD Allergy/Immunology [facility]

## 2020-05-22 LAB — ALLERGENS W/TOTAL IGE AREA 2
Alternaria Alternata IgE: <0.1 kU/L
Aspergillus Fumigatus IgE: 0.15 kU/L — AB
Bermuda Grass IgE: 1.78 kU/L — AB
Cat Dander IgE: <0.1 kU/L
Cedar, Mountain IgE: <0.1 kU/L
Cladosporium Herbarum IgE: <0.1 kU/L
Cockroach, German IgE: 0.33 kU/L — AB
Common Silver Birch IgE: <0.1 kU/L
Cottonwood IgE: <0.1 kU/L
D Farinae IgE: 1.49 kU/L — AB
D Pteronyssinus IgE: 1.45 kU/L — AB
Dog Dander IgE: <0.1 kU/L
Elm, American IgE: <0.1 kU/L
IgE (Immunoglobulin E), Serum: 446 IU/mL (ref 6–495)
Johnson Grass IgE: 0.99 kU/L — AB
Maple/Box Elder IgE: <0.1 kU/L
Mouse Urine IgE: <0.1 kU/L
Oak, White IgE: <0.1 kU/L
Pecan, Hickory IgE: <0.1 kU/L
Penicillium Chrysogen IgE: <0.1 kU/L
Pigweed, Rough IgE: <0.1 kU/L
Ragweed, Short IgE: 0.15 kU/L — AB
Sheep Sorrel IgE Qn: <0.1 kU/L
Timothy Grass IgE: 4.65 kU/L — AB
White Mulberry IgE: <0.1 kU/L

## 2020-05-31 NOTE — Telephone Encounter (Signed)
Patient states he would like to continue on with the immunotherapy.  Thanks

## 2020-06-03 DIAGNOSIS — E119 Type 2 diabetes mellitus without complications: Secondary | ICD-10-CM | POA: Diagnosis not present

## 2020-06-03 DIAGNOSIS — J4520 Mild intermittent asthma, uncomplicated: Secondary | ICD-10-CM | POA: Diagnosis not present

## 2020-06-03 DIAGNOSIS — E1169 Type 2 diabetes mellitus with other specified complication: Secondary | ICD-10-CM | POA: Diagnosis not present

## 2020-06-03 DIAGNOSIS — E7800 Pure hypercholesterolemia, unspecified: Secondary | ICD-10-CM | POA: Diagnosis not present

## 2020-06-03 DIAGNOSIS — N400 Enlarged prostate without lower urinary tract symptoms: Secondary | ICD-10-CM | POA: Diagnosis not present

## 2020-06-03 DIAGNOSIS — I10 Essential (primary) hypertension: Secondary | ICD-10-CM | POA: Diagnosis not present

## 2020-06-03 DIAGNOSIS — I2510 Atherosclerotic heart disease of native coronary artery without angina pectoris: Secondary | ICD-10-CM | POA: Diagnosis not present

## 2020-06-16 ENCOUNTER — Ambulatory Visit (INDEPENDENT_AMBULATORY_CARE_PROVIDER_SITE_OTHER): Payer: Medicare Other

## 2020-06-16 ENCOUNTER — Other Ambulatory Visit: Payer: Self-pay

## 2020-06-16 DIAGNOSIS — J309 Allergic rhinitis, unspecified: Secondary | ICD-10-CM

## 2020-06-21 ENCOUNTER — Ambulatory Visit (INDEPENDENT_AMBULATORY_CARE_PROVIDER_SITE_OTHER): Payer: Medicare Other

## 2020-06-21 ENCOUNTER — Other Ambulatory Visit: Payer: Self-pay

## 2020-06-21 DIAGNOSIS — J309 Allergic rhinitis, unspecified: Secondary | ICD-10-CM

## 2020-07-12 ENCOUNTER — Ambulatory Visit (INDEPENDENT_AMBULATORY_CARE_PROVIDER_SITE_OTHER): Payer: Medicare Other

## 2020-07-12 ENCOUNTER — Other Ambulatory Visit: Payer: Self-pay

## 2020-07-12 DIAGNOSIS — J309 Allergic rhinitis, unspecified: Secondary | ICD-10-CM

## 2020-07-19 ENCOUNTER — Other Ambulatory Visit: Payer: Self-pay

## 2020-07-19 ENCOUNTER — Ambulatory Visit (INDEPENDENT_AMBULATORY_CARE_PROVIDER_SITE_OTHER): Payer: Medicare Other

## 2020-07-19 DIAGNOSIS — J309 Allergic rhinitis, unspecified: Secondary | ICD-10-CM | POA: Diagnosis not present

## 2020-07-26 ENCOUNTER — Other Ambulatory Visit: Payer: Self-pay

## 2020-07-26 ENCOUNTER — Ambulatory Visit (INDEPENDENT_AMBULATORY_CARE_PROVIDER_SITE_OTHER): Payer: Medicare Other

## 2020-07-26 DIAGNOSIS — J309 Allergic rhinitis, unspecified: Secondary | ICD-10-CM

## 2020-07-31 ENCOUNTER — Other Ambulatory Visit: Payer: Self-pay | Admitting: Family Medicine

## 2020-08-02 ENCOUNTER — Ambulatory Visit (INDEPENDENT_AMBULATORY_CARE_PROVIDER_SITE_OTHER): Payer: Medicare Other

## 2020-08-02 ENCOUNTER — Other Ambulatory Visit: Payer: Self-pay

## 2020-08-02 DIAGNOSIS — J309 Allergic rhinitis, unspecified: Secondary | ICD-10-CM

## 2020-08-04 DIAGNOSIS — Z23 Encounter for immunization: Secondary | ICD-10-CM | POA: Diagnosis not present

## 2020-08-05 DIAGNOSIS — L508 Other urticaria: Secondary | ICD-10-CM | POA: Diagnosis not present

## 2020-08-05 DIAGNOSIS — L648 Other androgenic alopecia: Secondary | ICD-10-CM | POA: Diagnosis not present

## 2020-08-05 DIAGNOSIS — L218 Other seborrheic dermatitis: Secondary | ICD-10-CM | POA: Diagnosis not present

## 2020-08-12 ENCOUNTER — Telehealth: Payer: Self-pay | Admitting: Interventional Cardiology

## 2020-08-12 DIAGNOSIS — I25810 Atherosclerosis of coronary artery bypass graft(s) without angina pectoris: Secondary | ICD-10-CM

## 2020-08-12 DIAGNOSIS — E7849 Other hyperlipidemia: Secondary | ICD-10-CM

## 2020-08-12 DIAGNOSIS — I10 Essential (primary) hypertension: Secondary | ICD-10-CM

## 2020-08-12 NOTE — Telephone Encounter (Signed)
Patient requested a lipid panel order.

## 2020-08-12 NOTE — Telephone Encounter (Signed)
Pt calling to get annual lab appt prior to seeing Dr. Tamala Julian in January.  Scheduled pt to get labs in December and advised to come fasting.  Pt verbalized understanding and was in agreement with plan.

## 2020-09-08 ENCOUNTER — Other Ambulatory Visit: Payer: Self-pay

## 2020-09-08 ENCOUNTER — Ambulatory Visit (INDEPENDENT_AMBULATORY_CARE_PROVIDER_SITE_OTHER): Payer: Medicare Other

## 2020-09-08 DIAGNOSIS — J309 Allergic rhinitis, unspecified: Secondary | ICD-10-CM

## 2020-10-04 ENCOUNTER — Other Ambulatory Visit: Payer: Self-pay

## 2020-10-04 ENCOUNTER — Ambulatory Visit (INDEPENDENT_AMBULATORY_CARE_PROVIDER_SITE_OTHER): Payer: Medicare Other

## 2020-10-04 DIAGNOSIS — J309 Allergic rhinitis, unspecified: Secondary | ICD-10-CM | POA: Diagnosis not present

## 2020-10-11 ENCOUNTER — Other Ambulatory Visit: Payer: Medicare Other

## 2020-10-13 DIAGNOSIS — I10 Essential (primary) hypertension: Secondary | ICD-10-CM | POA: Diagnosis not present

## 2020-10-13 DIAGNOSIS — N400 Enlarged prostate without lower urinary tract symptoms: Secondary | ICD-10-CM | POA: Diagnosis not present

## 2020-10-13 DIAGNOSIS — N182 Chronic kidney disease, stage 2 (mild): Secondary | ICD-10-CM | POA: Diagnosis not present

## 2020-10-13 DIAGNOSIS — E1122 Type 2 diabetes mellitus with diabetic chronic kidney disease: Secondary | ICD-10-CM | POA: Diagnosis not present

## 2020-10-17 ENCOUNTER — Other Ambulatory Visit: Payer: Medicare Other | Admitting: *Deleted

## 2020-10-17 ENCOUNTER — Other Ambulatory Visit: Payer: Self-pay

## 2020-10-17 DIAGNOSIS — E7849 Other hyperlipidemia: Secondary | ICD-10-CM

## 2020-10-17 DIAGNOSIS — I10 Essential (primary) hypertension: Secondary | ICD-10-CM

## 2020-10-17 DIAGNOSIS — I25810 Atherosclerosis of coronary artery bypass graft(s) without angina pectoris: Secondary | ICD-10-CM

## 2020-10-17 LAB — BASIC METABOLIC PANEL
BUN/Creatinine Ratio: 15 (ref 10–24)
BUN: 13 mg/dL (ref 8–27)
CO2: 27 mmol/L (ref 20–29)
Calcium: 9.5 mg/dL (ref 8.6–10.2)
Chloride: 98 mmol/L (ref 96–106)
Creatinine, Ser: 0.88 mg/dL (ref 0.76–1.27)
GFR calc Af Amer: 96 mL/min/{1.73_m2} (ref >59)
GFR calc non Af Amer: 83 mL/min/{1.73_m2} (ref >59)
Glucose: 135 mg/dL — ABNORMAL HIGH (ref 65–99)
Potassium: 3.8 mmol/L (ref 3.5–5.2)
Sodium: 140 mmol/L (ref 134–144)

## 2020-10-17 LAB — HEPATIC FUNCTION PANEL
ALT: 29 IU/L (ref 0–44)
AST: 25 IU/L (ref 0–40)
Albumin: 4.5 g/dL (ref 3.7–4.7)
Alkaline Phosphatase: 48 IU/L (ref 44–121)
Bilirubin Total: 0.9 mg/dL (ref 0.0–1.2)
Bilirubin, Direct: 0.24 mg/dL (ref 0.00–0.40)
Total Protein: 7.1 g/dL (ref 6.0–8.5)

## 2020-10-17 LAB — LIPID PANEL
Chol/HDL Ratio: 2.7 ratio (ref 0.0–5.0)
Cholesterol, Total: 139 mg/dL (ref 100–199)
HDL: 52 mg/dL (ref >39)
LDL Chol Calc (NIH): 65 mg/dL (ref 0–99)
Triglycerides: 127 mg/dL (ref 0–149)
VLDL Cholesterol Cal: 22 mg/dL (ref 5–40)

## 2020-10-19 ENCOUNTER — Other Ambulatory Visit: Payer: Self-pay | Admitting: Interventional Cardiology

## 2020-11-08 ENCOUNTER — Other Ambulatory Visit: Payer: Self-pay

## 2020-11-08 ENCOUNTER — Ambulatory Visit (INDEPENDENT_AMBULATORY_CARE_PROVIDER_SITE_OTHER): Payer: Medicare Other

## 2020-11-08 DIAGNOSIS — J309 Allergic rhinitis, unspecified: Secondary | ICD-10-CM | POA: Diagnosis not present

## 2020-11-09 DIAGNOSIS — N5201 Erectile dysfunction due to arterial insufficiency: Secondary | ICD-10-CM | POA: Diagnosis not present

## 2020-11-09 DIAGNOSIS — N401 Enlarged prostate with lower urinary tract symptoms: Secondary | ICD-10-CM | POA: Diagnosis not present

## 2020-11-09 DIAGNOSIS — R351 Nocturia: Secondary | ICD-10-CM | POA: Diagnosis not present

## 2020-11-17 DIAGNOSIS — H25813 Combined forms of age-related cataract, bilateral: Secondary | ICD-10-CM | POA: Diagnosis not present

## 2020-11-17 DIAGNOSIS — H524 Presbyopia: Secondary | ICD-10-CM | POA: Diagnosis not present

## 2020-11-17 DIAGNOSIS — H40013 Open angle with borderline findings, low risk, bilateral: Secondary | ICD-10-CM | POA: Diagnosis not present

## 2020-11-17 DIAGNOSIS — E119 Type 2 diabetes mellitus without complications: Secondary | ICD-10-CM | POA: Diagnosis not present

## 2020-11-21 NOTE — Progress Notes (Signed)
Cardiology Office Note:    Date:  11/23/2020   ID:  [name] [name], DOB 1941-06-07, MRN [id number]  PCP:  Lavone Orn, MD  Cardiologist:  Sinclair Grooms, MD   Referring MD: Lavone Orn, MD   Chief Complaint  Patient presents with  . Coronary Artery Disease  . Hyperlipidemia    History of Present Illness:    [name] [name] is a 80 y.o. male with a hx of CABG 2008, hypertension, DM2, and hyperlipidemia.  His wife has dementia.  This impacts his ability to have time for exercise and personal care.  He has no symptoms.  During the recent snowstorm, he shoveled and cleared his driveway without difficulty.  He is not getting the level of exercise that he once did.  He has been placed on tadalafil for prostate.  He has not needed sublingual nitroglycerin.  The interaction between PDE 5 inhibitor therapy and nitroglycerin is discussed in detail.  He understands.  Not aware that he has a systolic murmur.  Past Medical History:  Diagnosis Date  . Allergic rhinitis    Immunotherapy, [place]  . BPH (benign prostatic hyperplasia)   . CAD (coronary artery disease)    CABG in 2008 with LIMA to LAD, SVG to Diag., SVG to OM1 and 2, SVG to PDA and PLOM, angioplasty x2 in 1990s, Dr. Tamala Julian.  . Constipation   . Coronary atherosclerosis of native coronary artery   . Diabetes mellitus type 2, uncontrolled ([place]) 11/2012  . ED (erectile dysfunction)   . Essential hypertension, benign    Good control  . Hemorrhoids   . Hyperlipidemia    Good control  . IBS (irritable bowel syndrome)    Hx of, with constipation    Past Surgical History:  Procedure Laterality Date  . CORONARY ANGIOPLASTY WITH STENT PLACEMENT  1990s   Angioplasty x2 in 1990s, Dr. Tamala Julian.  . CORONARY ARTERY BYPASS GRAFT  2008   With LIMA to LAD, SVG to Diag., SVG to OM1 and 2, SVG to PDA and PLOM, angioplasty x2 in 1990s, Dr. Tamala Julian.  . TONSILLECTOMY     teenager    Current Medications: Current Meds  Medication Sig   . albuterol (VENTOLIN HFA) 108 (90 Base) MCG/ACT inhaler TAKE 2 PUFFS BY MOUTH EVERY 4 HOURS AS NEEDED  . aspirin 81 MG tablet Take 81 mg by mouth daily.   Marland Kitchen atorvastatin (LIPITOR) 40 MG tablet Take 1 tablet (40 mg total) by mouth daily.  . Azelastine-Fluticasone (DYMISTA) 137-50 MCG/ACT SUSP PLACE 1 SPRAY INTO THE NOSE 2 (TWO) TIMES DAILY.  . beclomethasone (QVAR) 80 MCG/ACT inhaler Inhale 2 puffs into the lungs 2 (two) times daily.  . clobetasol (TEMOVATE) 0.05 % external solution Apply 1 application topically daily as needed (DANRUFF).   Marland Kitchen EPINEPHrine 0.3 mg/0.3 mL IJ SOAJ injection INJECT 0.3 MLS (0.3 MG TOTAL) INTO THE MUSCLE ONCE FOR 1 DOSE.  Marland Kitchen glucose blood test strip 1 each by Other route as needed for other (VERIGOLD). Use as instructed  . indapamide (LOZOL) 1.25 MG tablet TAKE 1 TABLET BY MOUTH EVERY DAY  . KLOR-CON M20 20 MEQ tablet   . Lancets MISC by Does not apply route.  . metoprolol succinate (TOPROL-XL) 50 MG 24 hr tablet Please keep upcoming appt for future refills. Thank you  . montelukast (SINGULAIR) 10 MG tablet TAKE 1 TABLET BY MOUTH EVERYDAY AT BEDTIME  . Multiple Vitamins-Minerals (MULTIVITAMIN PO) Take 1 tablet by mouth daily.   . nitroGLYCERIN (NITROSTAT) 0.4  MG SL tablet Place 0.4 mg under the tongue every 5 (five) minutes as needed for chest pain.  Marland Kitchen OVER THE COUNTER MEDICATION Take 1 tablet by mouth 2 (two) times daily. (SUPER BETA PROSTATE)  . pravastatin (PRAVACHOL) 40 MG tablet Take 40 mg by mouth daily.  . sertraline (ZOLOFT) 25 MG tablet Take 25 mg by mouth daily.  . tadalafil (CIALIS) 5 MG tablet Take 5 mg by mouth daily as needed for erectile dysfunction.  . valsartan (DIOVAN) 320 MG tablet Take 1 tablet (320 mg total) by mouth daily.     Allergies:   Elemental sulfur and Sulfa antibiotics   Social History   Socioeconomic History  . Marital status: Married    Spouse name: Not on file  . Number of children: Not on file  . Years of education: Not on  file  . Highest education level: Not on file  Occupational History  . Not on file  Tobacco Use  . Smoking status: Former Smoker    Types: Cigarettes    Quit date: 10/07/1967    Years since quitting: 53.1  . Smokeless tobacco: Never Used  Vaping Use  . Vaping Use: Never used  Substance and Sexual Activity  . Alcohol use: Yes    Comment: wine with evening meal occasionally  . Drug use: No  . Sexual activity: Not Currently  Other Topics Concern  . Not on file  Social History Narrative  . Not on file   Social Determinants of Health   Financial Resource Strain: Not on file  Food Insecurity: Not on file  Transportation Needs: Not on file  Physical Activity: Not on file  Stress: Not on file  Social Connections: Not on file     Family History: The patient's family history includes Asthma in his mother; CAD in his father; Colon cancer in his maternal aunt; Heart attack in his father.  ROS:   Please see the history of present illness.    Sad because of his wife.  Difficulty with urination.  Hydrochlorothiazide was decreased in the past because of pruritus.  Not been monitoring blood pressures at home.  All other systems reviewed and are negative.  EKGs/Labs/Other Studies Reviewed:    The following studies were reviewed today: No recent cardiac imaging.  Had myocardial perfusion imaging in 2017.  EKG:  EKG normal sinus rhythm with first-degree AV block PR interval 240 ms.  Nonspecific ST-T wave abnormality.  Recent Labs: 10/17/2020: ALT 29; BUN 13; Creatinine, Ser 0.88; Potassium 3.8; Sodium 140  Recent Lipid Panel    Component Value Date/Time   CHOL 139 10/17/2020 0850   TRIG 127 10/17/2020 0850   HDL 52 10/17/2020 0850   CHOLHDL 2.7 10/17/2020 0850   CHOLHDL 2.7 09/26/2016 1015   VLDL 23 09/26/2016 1015   LDLCALC 65 10/17/2020 0850    Physical Exam:    VS:  BP (!) 160/80 (BP Location: Left Arm, Patient Position: Sitting, Cuff Size: Normal)   Pulse (!) 57   Ht 5'  7" (1.702 m)   Wt 170 lb (77.1 kg)   SpO2 98%   BMI 26.63 kg/m     Wt Readings from Last 3 Encounters:  11/23/20 170 lb (77.1 kg)  05/19/20 169 lb 6.4 oz (76.8 kg)  11/10/19 175 lb 12.8 oz (79.7 kg)     GEN: Care is now great.  Stopped using coloring agents.. No acute distress HEENT: Normal NECK: No JVD. LYMPHATICS: No lymphadenopathy CARDIAC: 2/6 crescendo decrescendo right upper  sternal systolic murmur.  There is no diastolic murmur.. RRR no gallop, or edema. VASCULAR:  Normal Pulses. No bruits. RESPIRATORY:  Clear to auscultation without rales, wheezing or rhonchi  ABDOMEN: Soft, non-tender, non-distended, No pulsatile mass, MUSCULOSKELETAL: No deformity  SKIN: Warm and dry NEUROLOGIC:  Alert and oriented x 3 PSYCHIATRIC:  Normal affect   ASSESSMENT:    1. Coronary artery disease involving coronary bypass graft of native heart without angina pectoris   2. Other hyperlipidemia   3. Essential hypertension   4. Educated about COVID-19 virus infection   5. Murmur    PLAN:    In order of problems listed above:  1. Secondary prevention discussed.  Particular attention to increasing physical activity.  Recommended at least 6 episodes of 5 minutes walking within his house or around his house each day of the week of that on Saturday and Sunday.  Other measures were also discussed.  See below. 2. LDL target is being achieved.  LDL was less than 70.  Continue statin therapy. 3. Blood pressure is elevated.  Will need to decrease sodium intake to less than 3 g/day, increase physical activity, get greater than 6 hours of sleep per night, and avoid nonsteroidal anti-inflammatory therapy.  If remains above 130/80 mmHg, will need to intensify therapy.  He will notify us based upon serial blood pressure recordings if there is a problem. 4. He is vaccinated, boosted, and practicing medication. 5. 2D Doppler echocardiogram will be done to assess aortic valve which sounds like he is developing  aortic valve sclerosis or stenosis.  Overall education and awareness concerning primary/secondary risk prevention was discussed in detail: LDL less than 70, hemoglobin A1c less than 7, blood pressure target less than 130/80 mmHg, >150 minutes of moderate aerobic activity per week, avoidance of smoking, weight control (via diet and exercise), and continued surveillance/management of/for obstructive sleep apnea.    Medication Adjustments/Labs and Tests Ordered: Current medicines are reviewed at length with the patient today.  Concerns regarding medicines are outlined above.  Orders Placed This Encounter  Procedures  . EKG 12-Lead  . ECHOCARDIOGRAM COMPLETE   No orders of the defined types were placed in this encounter.   Patient Instructions  Medication Instructions:  Your physician recommends that you continue on your current medications as directed. Please refer to the Current Medication list given to you today.  *If you need a refill on your cardiac medications before your next appointment, please call your pharmacy*   Lab Work: None If you have labs (blood work) drawn today and your tests are completely normal, you will receive your results only by: Marland Kitchen MyChart Message (if you have MyChart) OR . A paper copy in the mail If you have any lab test that is abnormal or we need to change your treatment, we will call you to review the results.   Testing/Procedures: Your physician has requested that you have an echocardiogram. Echocardiography is a painless test that uses sound waves to create images of your heart. It provides your doctor with information about the size and shape of your heart and how well your heart's chambers and valves are working. This procedure takes approximately one hour. There are no restrictions for this procedure.   Follow-Up: At [facility], you and your health needs are our priority.  As part of our continuing mission to provide you with exceptional heart  care, we have created designated Provider Care Teams.  These Care Teams include your primary Cardiologist (physician) and Advanced  Practice Providers (APPs -  Physician Assistants and Nurse Practitioners) who all work together to provide you with the care you need, when you need it.  We recommend signing up for the patient portal called "MyChart".  Sign up information is provided on this After Visit Summary.  MyChart is used to connect with patients for Virtual Visits (Telemedicine).  Patients are able to view lab/test results, encounter notes, upcoming appointments, etc.  Non-urgent messages can be sent to your provider as well.   To learn more about what you can do with MyChart, go to [email or web address].    Your next appointment:   1 year(s)  The format for your next appointment:   In Person  Provider:   You may see Sinclair Grooms, MD or one of the following Advanced Practice Providers on your designated Care Team:    Kathyrn Drown, NP    Other Instructions      Signed, Sinclair Grooms, MD  11/23/2020 9:49 AM    [place]

## 2020-11-23 ENCOUNTER — Other Ambulatory Visit: Payer: Self-pay

## 2020-11-23 ENCOUNTER — Encounter: Payer: Self-pay | Admitting: Interventional Cardiology

## 2020-11-23 ENCOUNTER — Ambulatory Visit (INDEPENDENT_AMBULATORY_CARE_PROVIDER_SITE_OTHER): Payer: Medicare Other | Admitting: Interventional Cardiology

## 2020-11-23 VITALS — BP 160/80 | HR 57 | Ht 67.0 in | Wt 170.0 lb

## 2020-11-23 DIAGNOSIS — R011 Cardiac murmur, unspecified: Secondary | ICD-10-CM | POA: Diagnosis not present

## 2020-11-23 DIAGNOSIS — E7849 Other hyperlipidemia: Secondary | ICD-10-CM | POA: Diagnosis not present

## 2020-11-23 DIAGNOSIS — I10 Essential (primary) hypertension: Secondary | ICD-10-CM

## 2020-11-23 DIAGNOSIS — I25810 Atherosclerosis of coronary artery bypass graft(s) without angina pectoris: Secondary | ICD-10-CM | POA: Diagnosis not present

## 2020-11-23 DIAGNOSIS — Z7189 Other specified counseling: Secondary | ICD-10-CM | POA: Diagnosis not present

## 2020-11-23 NOTE — Patient Instructions (Signed)
Medication Instructions:  Your physician recommends that you continue on your current medications as directed. Please refer to the Current Medication list given to you today.  *If you need a refill on your cardiac medications before your next appointment, please call your pharmacy*   Lab Work: None If you have labs (blood work) drawn today and your tests are completely normal, you will receive your results only by: . MyChart Message (if you have MyChart) OR . A paper copy in the mail If you have any lab test that is abnormal or we need to change your treatment, we will call you to review the results.   Testing/Procedures: Your physician has requested that you have an echocardiogram. Echocardiography is a painless test that uses sound waves to create images of your heart. It provides your doctor with information about the size and shape of your heart and how well your heart's chambers and valves are working. This procedure takes approximately one hour. There are no restrictions for this procedure.   Follow-Up: At [facility], you and your health needs are our priority.  As part of our continuing mission to provide you with exceptional heart care, we have created designated Provider Care Teams.  These Care Teams include your primary Cardiologist (physician) and Advanced Practice Providers (APPs -  Physician Assistants and Nurse Practitioners) who all work together to provide you with the care you need, when you need it.  We recommend signing up for the patient portal called "MyChart".  Sign up information is provided on this After Visit Summary.  MyChart is used to connect with patients for Virtual Visits (Telemedicine).  Patients are able to view lab/test results, encounter notes, upcoming appointments, etc.  Non-urgent messages can be sent to your provider as well.   To learn more about what you can do with MyChart, go to [email or web address].    Your next appointment:   1  year(s)  The format for your next appointment:   In Person  Provider:   You may see Henry W Smith III, MD or one of the following Advanced Practice Providers on your designated Care Team:    Jill McDaniel, NP    Other Instructions   

## 2020-12-07 ENCOUNTER — Ambulatory Visit (HOSPITAL_COMMUNITY): Payer: Medicare Other | Attending: Cardiovascular Disease

## 2020-12-07 ENCOUNTER — Other Ambulatory Visit: Payer: Self-pay

## 2020-12-07 DIAGNOSIS — R011 Cardiac murmur, unspecified: Secondary | ICD-10-CM

## 2020-12-07 LAB — ECHOCARDIOGRAM COMPLETE
Area-P 1/2: 4.31 cm2
P 1/2 time: 525 msec
S' Lateral: 2.8 cm

## 2020-12-08 ENCOUNTER — Other Ambulatory Visit: Payer: Self-pay | Admitting: Interventional Cardiology

## 2020-12-08 ENCOUNTER — Ambulatory Visit (INDEPENDENT_AMBULATORY_CARE_PROVIDER_SITE_OTHER): Payer: Medicare Other

## 2020-12-08 DIAGNOSIS — J309 Allergic rhinitis, unspecified: Secondary | ICD-10-CM

## 2020-12-21 DIAGNOSIS — J3089 Other allergic rhinitis: Secondary | ICD-10-CM | POA: Diagnosis not present

## 2020-12-21 NOTE — Progress Notes (Signed)
VIAL EXP 12-21-21

## 2021-01-05 ENCOUNTER — Other Ambulatory Visit: Payer: Self-pay

## 2021-01-05 ENCOUNTER — Ambulatory Visit (INDEPENDENT_AMBULATORY_CARE_PROVIDER_SITE_OTHER): Payer: Medicare Other

## 2021-01-05 DIAGNOSIS — J309 Allergic rhinitis, unspecified: Secondary | ICD-10-CM | POA: Diagnosis not present

## 2021-01-22 DIAGNOSIS — N400 Enlarged prostate without lower urinary tract symptoms: Secondary | ICD-10-CM | POA: Diagnosis not present

## 2021-01-22 DIAGNOSIS — I2510 Atherosclerotic heart disease of native coronary artery without angina pectoris: Secondary | ICD-10-CM | POA: Diagnosis not present

## 2021-01-22 DIAGNOSIS — I10 Essential (primary) hypertension: Secondary | ICD-10-CM | POA: Diagnosis not present

## 2021-01-22 DIAGNOSIS — E7800 Pure hypercholesterolemia, unspecified: Secondary | ICD-10-CM | POA: Diagnosis not present

## 2021-01-22 DIAGNOSIS — J4520 Mild intermittent asthma, uncomplicated: Secondary | ICD-10-CM | POA: Diagnosis not present

## 2021-01-22 DIAGNOSIS — E1169 Type 2 diabetes mellitus with other specified complication: Secondary | ICD-10-CM | POA: Diagnosis not present

## 2021-01-22 DIAGNOSIS — E1122 Type 2 diabetes mellitus with diabetic chronic kidney disease: Secondary | ICD-10-CM | POA: Diagnosis not present

## 2021-01-22 DIAGNOSIS — N182 Chronic kidney disease, stage 2 (mild): Secondary | ICD-10-CM | POA: Diagnosis not present

## 2021-02-09 ENCOUNTER — Ambulatory Visit (INDEPENDENT_AMBULATORY_CARE_PROVIDER_SITE_OTHER): Payer: Medicare Other

## 2021-02-09 ENCOUNTER — Other Ambulatory Visit: Payer: Self-pay

## 2021-02-09 DIAGNOSIS — J309 Allergic rhinitis, unspecified: Secondary | ICD-10-CM

## 2021-02-11 ENCOUNTER — Other Ambulatory Visit: Payer: Self-pay | Admitting: Family Medicine

## 2021-02-26 ENCOUNTER — Other Ambulatory Visit: Payer: Self-pay | Admitting: Family Medicine

## 2021-02-26 DIAGNOSIS — H1010 Acute atopic conjunctivitis, unspecified eye: Secondary | ICD-10-CM

## 2021-02-26 DIAGNOSIS — J309 Allergic rhinitis, unspecified: Secondary | ICD-10-CM

## 2021-03-30 ENCOUNTER — Ambulatory Visit: Payer: Medicare Other

## 2021-03-30 ENCOUNTER — Other Ambulatory Visit: Payer: Self-pay

## 2021-03-30 ENCOUNTER — Telehealth: Payer: Self-pay

## 2021-03-30 NOTE — Telephone Encounter (Signed)
Please restart at red 0.0025cc, then give red 0.05 then weekly injections and build up using schedule C.  Thank you.

## 2021-03-30 NOTE — Telephone Encounter (Signed)
Patient hasn't been to get an injection in 7 weeks, what dose should I give him?  Last injection 02/09/21 Red .2 Schedule C

## 2021-03-31 NOTE — Telephone Encounter (Signed)
Flowsheet has been updated to reflect this change, patient has been made aware and verbalized understanding.

## 2021-04-04 ENCOUNTER — Other Ambulatory Visit: Payer: Self-pay

## 2021-04-04 ENCOUNTER — Ambulatory Visit (INDEPENDENT_AMBULATORY_CARE_PROVIDER_SITE_OTHER): Payer: Medicare Other

## 2021-04-04 DIAGNOSIS — J309 Allergic rhinitis, unspecified: Secondary | ICD-10-CM

## 2021-04-11 ENCOUNTER — Ambulatory Visit (INDEPENDENT_AMBULATORY_CARE_PROVIDER_SITE_OTHER): Payer: Medicare Other

## 2021-04-11 ENCOUNTER — Other Ambulatory Visit: Payer: Self-pay

## 2021-04-11 DIAGNOSIS — J309 Allergic rhinitis, unspecified: Secondary | ICD-10-CM | POA: Diagnosis not present

## 2021-04-18 ENCOUNTER — Other Ambulatory Visit: Payer: Self-pay

## 2021-04-18 ENCOUNTER — Ambulatory Visit (INDEPENDENT_AMBULATORY_CARE_PROVIDER_SITE_OTHER): Payer: Medicare Other

## 2021-04-18 DIAGNOSIS — J309 Allergic rhinitis, unspecified: Secondary | ICD-10-CM | POA: Diagnosis not present

## 2021-04-20 DIAGNOSIS — N400 Enlarged prostate without lower urinary tract symptoms: Secondary | ICD-10-CM | POA: Diagnosis not present

## 2021-04-20 DIAGNOSIS — J4520 Mild intermittent asthma, uncomplicated: Secondary | ICD-10-CM | POA: Diagnosis not present

## 2021-04-20 DIAGNOSIS — E7800 Pure hypercholesterolemia, unspecified: Secondary | ICD-10-CM | POA: Diagnosis not present

## 2021-04-20 DIAGNOSIS — E119 Type 2 diabetes mellitus without complications: Secondary | ICD-10-CM | POA: Diagnosis not present

## 2021-04-20 DIAGNOSIS — I2510 Atherosclerotic heart disease of native coronary artery without angina pectoris: Secondary | ICD-10-CM | POA: Diagnosis not present

## 2021-04-20 DIAGNOSIS — E1169 Type 2 diabetes mellitus with other specified complication: Secondary | ICD-10-CM | POA: Diagnosis not present

## 2021-04-20 DIAGNOSIS — E1122 Type 2 diabetes mellitus with diabetic chronic kidney disease: Secondary | ICD-10-CM | POA: Diagnosis not present

## 2021-04-20 DIAGNOSIS — I10 Essential (primary) hypertension: Secondary | ICD-10-CM | POA: Diagnosis not present

## 2021-04-20 DIAGNOSIS — N182 Chronic kidney disease, stage 2 (mild): Secondary | ICD-10-CM | POA: Diagnosis not present

## 2021-04-25 ENCOUNTER — Ambulatory Visit (INDEPENDENT_AMBULATORY_CARE_PROVIDER_SITE_OTHER): Payer: Medicare Other

## 2021-04-25 ENCOUNTER — Other Ambulatory Visit: Payer: Self-pay

## 2021-04-25 DIAGNOSIS — J309 Allergic rhinitis, unspecified: Secondary | ICD-10-CM

## 2021-05-04 ENCOUNTER — Other Ambulatory Visit: Payer: Self-pay

## 2021-05-04 ENCOUNTER — Ambulatory Visit (INDEPENDENT_AMBULATORY_CARE_PROVIDER_SITE_OTHER): Payer: Medicare Other

## 2021-05-04 DIAGNOSIS — J309 Allergic rhinitis, unspecified: Secondary | ICD-10-CM | POA: Diagnosis not present

## 2021-05-06 ENCOUNTER — Other Ambulatory Visit: Payer: Self-pay | Admitting: Interventional Cardiology

## 2021-05-11 ENCOUNTER — Other Ambulatory Visit: Payer: Self-pay

## 2021-05-11 ENCOUNTER — Ambulatory Visit (INDEPENDENT_AMBULATORY_CARE_PROVIDER_SITE_OTHER): Payer: Medicare Other

## 2021-05-11 DIAGNOSIS — J309 Allergic rhinitis, unspecified: Secondary | ICD-10-CM | POA: Diagnosis not present

## 2021-05-16 ENCOUNTER — Ambulatory Visit (INDEPENDENT_AMBULATORY_CARE_PROVIDER_SITE_OTHER): Payer: Medicare Other

## 2021-05-16 ENCOUNTER — Other Ambulatory Visit: Payer: Self-pay

## 2021-05-16 DIAGNOSIS — J309 Allergic rhinitis, unspecified: Secondary | ICD-10-CM

## 2021-05-17 DIAGNOSIS — R351 Nocturia: Secondary | ICD-10-CM | POA: Diagnosis not present

## 2021-05-17 DIAGNOSIS — N5201 Erectile dysfunction due to arterial insufficiency: Secondary | ICD-10-CM | POA: Diagnosis not present

## 2021-05-17 DIAGNOSIS — N401 Enlarged prostate with lower urinary tract symptoms: Secondary | ICD-10-CM | POA: Diagnosis not present

## 2021-05-17 DIAGNOSIS — R8271 Bacteriuria: Secondary | ICD-10-CM | POA: Diagnosis not present

## 2021-05-19 ENCOUNTER — Encounter: Payer: Self-pay | Admitting: Allergy

## 2021-05-19 ENCOUNTER — Other Ambulatory Visit: Payer: Self-pay

## 2021-05-19 ENCOUNTER — Ambulatory Visit (INDEPENDENT_AMBULATORY_CARE_PROVIDER_SITE_OTHER): Payer: Medicare Other | Admitting: Allergy

## 2021-05-19 VITALS — BP 142/72 | HR 61 | Temp 97.7°F | Resp 18 | Ht 67.0 in | Wt 172.8 lb

## 2021-05-19 DIAGNOSIS — H1013 Acute atopic conjunctivitis, bilateral: Secondary | ICD-10-CM

## 2021-05-19 DIAGNOSIS — I25810 Atherosclerosis of coronary artery bypass graft(s) without angina pectoris: Secondary | ICD-10-CM | POA: Diagnosis not present

## 2021-05-19 DIAGNOSIS — J4520 Mild intermittent asthma, uncomplicated: Secondary | ICD-10-CM

## 2021-05-19 DIAGNOSIS — J3089 Other allergic rhinitis: Secondary | ICD-10-CM | POA: Diagnosis not present

## 2021-05-19 MED ORDER — EPINEPHRINE 0.3 MG/0.3ML IJ SOAJ: 0.3000 mg | INTRAMUSCULAR | 1 refills | Status: DC | PRN

## 2021-05-19 MED ORDER — MONTELUKAST SODIUM 10 MG PO TABS: 10.0000 mg | ORAL_TABLET | Freq: Every day | ORAL | 5 refills | Status: DC

## 2021-05-19 NOTE — Patient Instructions (Addendum)
Mild persistent asthma - Well-controlled - Continue Singulair '10mg'$  at bedtime - Continue albuterol 2 puffs every 4-6 hours as needed for cough, wheeze, shortness of breath - Asthma action plan: Start Qvar 80 g 2 puffs twice a day during respiratory illness/asthma exacerbation Asthma control goals:  Full participation in all desired activities (may need albuterol before activity) Albuterol use two time or less a week on average (not counting use with activity) Cough interfering with sleep two time or less a month Oral steroids no more than once a year No hospitalizations   Allergic rhinoconjunctivitis - Continue avoidance: Weeds, Dust Mite and Mold.       - Claritin '10mg'$  by mouth once daily for runny nose or itching as needed. - Continue Dymista 1-2 spray(s) each nostril daily as needed for stuffy nose or drainage. - Continue monthly allergen immunotherapy for now will plan to re-evaluate dust mite allergy next year           -Epi-pen/Benadryl as needed.   Follow up Visit: 12 months or sooner if needed.

## 2021-05-19 NOTE — Progress Notes (Signed)
Follow-up Note  RE: [name] [name] MRN: [id number] DOB: 1941/05/13 Date of Office Visit: 05/19/2021   History of present illness: [name] [name] is a 80 y.o. male presenting today for follow-up of asthma, allergic rhinitis with conjunctivitis.  He was last seen in the office on 05/19/2020 by myself.  He states he has been doing over the past year.  He has not had any major health changes, surgeries or hospitalizations in the past year. In regards to his allergies he states he has been doing well without any symptoms.  He does not note any increase in nasal ocular symptoms during grass season.  He only takes Singulair nightly.  He will use Claritin as needed but states he has not needed to use this.  He also has access to Dymista that he has not needed to use as he denies nasal congestion or drainage.  He does continue on monthly allergy injections and tolerating those well without any large local or systemic reactions. His asthma is doing well without any flares.  He denies using albuterol in the past year.  He has not needed to initiate Qvar for any flares.  He has not had any ED or urgent care visits with systemic steroid needs.  Review of systems: Review of Systems  Constitutional: Negative.   HENT: Negative.    Eyes: Negative.   Respiratory: Negative.    Cardiovascular: Negative.   Gastrointestinal: Negative.   Musculoskeletal: Negative.   Skin: Negative.   Neurological: Negative.    All other systems negative unless noted above in HPI  Past medical/social/surgical/family history have been reviewed and are unchanged unless specifically indicated below.  No changes  Medication List: Current Outpatient Medications  Medication Sig Dispense Refill   albuterol (VENTOLIN HFA) 108 (90 Base) MCG/ACT inhaler TAKE 2 PUFFS BY MOUTH EVERY 4 HOURS AS NEEDED 18 g 1   aspirin 81 MG tablet Take 81 mg by mouth daily.      Azelastine-Fluticasone 137-50 MCG/ACT SUSP PLACE 1 SPRAY INTO THE NOSE 2  (TWO) TIMES DAILY. 23 g 5   beclomethasone (QVAR) 80 MCG/ACT inhaler Inhale 2 puffs into the lungs 2 (two) times daily. 1 Inhaler 5   clobetasol (TEMOVATE) 0.05 % external solution Apply 1 application topically daily as needed (DANRUFF).      EPINEPHrine 0.3 mg/0.3 mL IJ SOAJ injection INJECT 0.3 MLS (0.3 MG TOTAL) INTO THE MUSCLE ONCE FOR 1 DOSE.     glucose blood test strip 1 each by Other route as needed for other (VERIGOLD). Use as instructed     indapamide (LOZOL) 1.25 MG tablet TAKE 1 TABLET BY MOUTH EVERY DAY 90 tablet 0   KLOR-CON M20 20 MEQ tablet      Lancets MISC by Does not apply route.     metoprolol succinate (TOPROL-XL) 50 MG 24 hr tablet Take 1 tablet (50 mg total) by mouth daily. 90 tablet 1   montelukast (SINGULAIR) 10 MG tablet TAKE 1 TABLET BY MOUTH EVERYDAY AT BEDTIME 90 tablet 1   Multiple Vitamins-Minerals (MULTIVITAMIN PO) Take 1 tablet by mouth daily.      nitroGLYCERIN (NITROSTAT) 0.4 MG SL tablet Place 0.4 mg under the tongue every 5 (five) minutes as needed for chest pain.     pravastatin (PRAVACHOL) 40 MG tablet Take 40 mg by mouth daily.  3   sertraline (ZOLOFT) 25 MG tablet Take 25 mg by mouth daily.     valsartan (DIOVAN) 320 MG tablet Take 1 tablet (320  mg total) by mouth daily. 90 tablet 3   No current facility-administered medications for this visit.     Known medication allergies: Allergies  Allergen Reactions   Elemental Sulfur Rash   Sulfa Antibiotics Rash          Physical examination: Blood pressure (!) 142/72, pulse 61, temperature 97.7 F (36.5 C), temperature source Temporal, resp. rate 18, height '5\' 7"'$  (1.702 m), weight 172 lb 12.8 oz (78.4 kg), SpO2 95 %.  General: Alert, interactive, in no acute distress. HEENT: PERRLA, TMs pearly gray, turbinates non-edematous without discharge, post-pharynx non erythematous. Neck: Supple without lymphadenopathy. Lungs: Clear to auscultation without wheezing, rhonchi or rales. {no increased work of  breathing. CV: Normal S1, S2 without murmurs. Abdomen: Nondistended, nontender. Skin: Warm and dry, without lesions or rashes. Extremities:  No clubbing, cyanosis or edema. Neuro:   Grossly intact.  Diagnositics/Labs:  ACT score 25 which indicates good control  Spirometry: FEV1: 2.07 L 91%, FVC: 2.95 L 98, ratio consistent with nonobstructive pattern  Assessment and plan:   Mild persistent asthma - Well-controlled - Continue Singulair '10mg'$  at bedtime - Continue albuterol 2 puffs every 4-6 hours as needed for cough, wheeze, shortness of breath - Asthma action plan: Start Qvar 80 g 2 puffs twice a day during respiratory illness/asthma exacerbation Asthma control goals:  Full participation in all desired activities (may need albuterol before activity) Albuterol use two time or less a week on average (not counting use with activity) Cough interfering with sleep two time or less a month Oral steroids no more than once a year No hospitalizations   Allergic rhinoconjunctivitis - Continue avoidance: Weeds, Dust Mite and Mold.       - Claritin '10mg'$  by mouth once daily for runny nose or itching as needed. - Continue Dymista 1-2 spray(s) each nostril daily as needed for stuffy nose or drainage. - Continue monthly allergen immunotherapy for now will plan to re-evaluate dust mite allergy next year           -Epi-pen/Benadryl as needed.   Follow up Visit: 12 months or sooner if needed.  I appreciate the opportunity to take part in [place]'s care. Please do not hesitate to contact me with questions.  Sincerely,   Prudy Feeler, MD Allergy/Immunology [facility] [place] [facility] [place]

## 2021-06-13 ENCOUNTER — Ambulatory Visit (INDEPENDENT_AMBULATORY_CARE_PROVIDER_SITE_OTHER): Payer: Medicare Other

## 2021-06-13 ENCOUNTER — Other Ambulatory Visit: Payer: Self-pay

## 2021-06-13 DIAGNOSIS — J309 Allergic rhinitis, unspecified: Secondary | ICD-10-CM | POA: Diagnosis not present

## 2021-06-28 DIAGNOSIS — J3089 Other allergic rhinitis: Secondary | ICD-10-CM | POA: Diagnosis not present

## 2021-06-28 NOTE — Progress Notes (Signed)
VIAL MADE. EXP 06-28-22

## 2021-07-11 ENCOUNTER — Ambulatory Visit (INDEPENDENT_AMBULATORY_CARE_PROVIDER_SITE_OTHER): Payer: Medicare Other

## 2021-07-11 ENCOUNTER — Other Ambulatory Visit: Payer: Self-pay

## 2021-07-11 DIAGNOSIS — J309 Allergic rhinitis, unspecified: Secondary | ICD-10-CM

## 2021-07-25 DIAGNOSIS — I2510 Atherosclerotic heart disease of native coronary artery without angina pectoris: Secondary | ICD-10-CM | POA: Diagnosis not present

## 2021-07-25 DIAGNOSIS — N182 Chronic kidney disease, stage 2 (mild): Secondary | ICD-10-CM | POA: Diagnosis not present

## 2021-07-25 DIAGNOSIS — E1122 Type 2 diabetes mellitus with diabetic chronic kidney disease: Secondary | ICD-10-CM | POA: Diagnosis not present

## 2021-07-25 DIAGNOSIS — E119 Type 2 diabetes mellitus without complications: Secondary | ICD-10-CM | POA: Diagnosis not present

## 2021-07-25 DIAGNOSIS — N400 Enlarged prostate without lower urinary tract symptoms: Secondary | ICD-10-CM | POA: Diagnosis not present

## 2021-07-25 DIAGNOSIS — J4520 Mild intermittent asthma, uncomplicated: Secondary | ICD-10-CM | POA: Diagnosis not present

## 2021-07-25 DIAGNOSIS — I10 Essential (primary) hypertension: Secondary | ICD-10-CM | POA: Diagnosis not present

## 2021-07-25 DIAGNOSIS — E1169 Type 2 diabetes mellitus with other specified complication: Secondary | ICD-10-CM | POA: Diagnosis not present

## 2021-07-25 DIAGNOSIS — E7800 Pure hypercholesterolemia, unspecified: Secondary | ICD-10-CM | POA: Diagnosis not present

## 2021-07-31 DIAGNOSIS — Z23 Encounter for immunization: Secondary | ICD-10-CM | POA: Diagnosis not present

## 2021-08-10 ENCOUNTER — Ambulatory Visit (INDEPENDENT_AMBULATORY_CARE_PROVIDER_SITE_OTHER): Payer: Medicare Other

## 2021-08-10 ENCOUNTER — Other Ambulatory Visit: Payer: Self-pay

## 2021-08-10 DIAGNOSIS — J309 Allergic rhinitis, unspecified: Secondary | ICD-10-CM | POA: Diagnosis not present

## 2021-09-05 ENCOUNTER — Other Ambulatory Visit: Payer: Self-pay

## 2021-09-05 ENCOUNTER — Ambulatory Visit (INDEPENDENT_AMBULATORY_CARE_PROVIDER_SITE_OTHER): Payer: Medicare Other

## 2021-09-05 DIAGNOSIS — J309 Allergic rhinitis, unspecified: Secondary | ICD-10-CM | POA: Diagnosis not present

## 2021-10-03 ENCOUNTER — Other Ambulatory Visit: Payer: Self-pay

## 2021-10-03 ENCOUNTER — Ambulatory Visit (INDEPENDENT_AMBULATORY_CARE_PROVIDER_SITE_OTHER): Payer: Medicare Other

## 2021-10-03 DIAGNOSIS — J309 Allergic rhinitis, unspecified: Secondary | ICD-10-CM

## 2021-10-03 DIAGNOSIS — L218 Other seborrheic dermatitis: Secondary | ICD-10-CM | POA: Diagnosis not present

## 2021-10-31 DIAGNOSIS — I10 Essential (primary) hypertension: Secondary | ICD-10-CM | POA: Diagnosis not present

## 2021-10-31 DIAGNOSIS — I2510 Atherosclerotic heart disease of native coronary artery without angina pectoris: Secondary | ICD-10-CM | POA: Diagnosis not present

## 2021-10-31 DIAGNOSIS — N400 Enlarged prostate without lower urinary tract symptoms: Secondary | ICD-10-CM | POA: Diagnosis not present

## 2021-10-31 DIAGNOSIS — E7800 Pure hypercholesterolemia, unspecified: Secondary | ICD-10-CM | POA: Diagnosis not present

## 2021-10-31 DIAGNOSIS — F418 Other specified anxiety disorders: Secondary | ICD-10-CM | POA: Diagnosis not present

## 2021-10-31 DIAGNOSIS — Z1389 Encounter for screening for other disorder: Secondary | ICD-10-CM | POA: Diagnosis not present

## 2021-10-31 DIAGNOSIS — Z23 Encounter for immunization: Secondary | ICD-10-CM | POA: Diagnosis not present

## 2021-10-31 DIAGNOSIS — E1169 Type 2 diabetes mellitus with other specified complication: Secondary | ICD-10-CM | POA: Diagnosis not present

## 2021-11-07 ENCOUNTER — Ambulatory Visit (INDEPENDENT_AMBULATORY_CARE_PROVIDER_SITE_OTHER): Payer: Medicare Other

## 2021-11-07 ENCOUNTER — Other Ambulatory Visit: Payer: Self-pay

## 2021-11-07 DIAGNOSIS — J309 Allergic rhinitis, unspecified: Secondary | ICD-10-CM | POA: Diagnosis not present

## 2021-12-11 ENCOUNTER — Other Ambulatory Visit: Payer: Self-pay | Admitting: Interventional Cardiology

## 2021-12-19 ENCOUNTER — Other Ambulatory Visit: Payer: Self-pay

## 2021-12-19 ENCOUNTER — Ambulatory Visit (INDEPENDENT_AMBULATORY_CARE_PROVIDER_SITE_OTHER): Payer: Medicare Other

## 2021-12-19 DIAGNOSIS — J309 Allergic rhinitis, unspecified: Secondary | ICD-10-CM

## 2021-12-26 ENCOUNTER — Ambulatory Visit (INDEPENDENT_AMBULATORY_CARE_PROVIDER_SITE_OTHER): Payer: Medicare Other

## 2021-12-26 ENCOUNTER — Other Ambulatory Visit: Payer: Self-pay

## 2021-12-26 DIAGNOSIS — J309 Allergic rhinitis, unspecified: Secondary | ICD-10-CM | POA: Diagnosis not present

## 2021-12-27 ENCOUNTER — Ambulatory Visit (INDEPENDENT_AMBULATORY_CARE_PROVIDER_SITE_OTHER): Payer: Medicare Other | Admitting: Interventional Cardiology

## 2021-12-27 ENCOUNTER — Encounter: Payer: Self-pay | Admitting: Interventional Cardiology

## 2021-12-27 VITALS — BP 148/80 | HR 61 | Ht 67.0 in | Wt 168.6 lb

## 2021-12-27 DIAGNOSIS — I10 Essential (primary) hypertension: Secondary | ICD-10-CM

## 2021-12-27 DIAGNOSIS — R011 Cardiac murmur, unspecified: Secondary | ICD-10-CM

## 2021-12-27 DIAGNOSIS — I25810 Atherosclerosis of coronary artery bypass graft(s) without angina pectoris: Secondary | ICD-10-CM | POA: Diagnosis not present

## 2021-12-27 DIAGNOSIS — E7849 Other hyperlipidemia: Secondary | ICD-10-CM | POA: Diagnosis not present

## 2021-12-27 NOTE — Patient Instructions (Signed)
Medication Instructions:  ?Your physician recommends that you continue on your current medications as directed. Please refer to the Current Medication list given to you today. ? ?*If you need a refill on your cardiac medications before your next appointment, please call your pharmacy* ? ? ?Lab Work: ?None ?If you have labs (blood work) drawn today and your tests are completely normal, you will receive your results only by: ?MyChart Message (if you have MyChart) OR ?A paper copy in the mail ?If you have any lab test that is abnormal or we need to change your treatment, we will call you to review the results. ? ? ?Testing/Procedures: ?None ? ? ?Follow-Up: ?At [facility], you and your health needs are our priority.  As part of our continuing mission to provide you with exceptional heart care, we have created designated Provider Care Teams.  These Care Teams include your primary Cardiologist (physician) and Advanced Practice Providers (APPs -  Physician Assistants and Nurse Practitioners) who all work together to provide you with the care you need, when you need it. ? ?We recommend signing up for the patient portal called "MyChart".  Sign up information is provided on this After Visit Summary.  MyChart is used to connect with patients for Virtual Visits (Telemedicine).  Patients are able to view lab/test results, encounter notes, upcoming appointments, etc.  Non-urgent messages can be sent to your provider as well.   ?To learn more about what you can do with MyChart, go to [email or web address].   ? ?Your next appointment:   ?1 year(s) ? ?The format for your next appointment:   ?In Person ? ?Provider:   ?Sinclair Grooms, MD   ? ? ?Other Instructions ?Monitor your blood pressure 2-3 times a month.  If you notice your top number is consistently above 140, please let us know.  ?

## 2021-12-27 NOTE — Progress Notes (Signed)
Cardiology Office Note:    Date:  12/27/2021   ID:  [name] [name], DOB 07/18/1941, MRN [id number]  PCP:  Lavone Orn, MD  Cardiologist:  Sinclair Grooms, MD   Referring MD: Lavone Orn, MD   Chief Complaint  Patient presents with   Coronary Artery Disease   Hyperlipidemia   Hypertension    History of Present Illness:    [name] [name] is a 81 y.o. male with a hx of CABG 2008 , hypertension, DM2, and hyperlipidemia.   He is under significant stress related to his wife's cognitive impairment, the time required to adequately care for her, the difficulty with having the prepared meals since his wife is unable to cook, and difficulty sleeping because of nocturia.  He denies angina.  No palpitations.  He has not had syncope.  He has not been as able to freely exercise because he has concerns about leaving his wife alone at home.  There have been no symptoms that causes him to have concern for changing cardiac status.  Past Medical History:  Diagnosis Date   Allergic rhinitis    Immunotherapy, Bardelas   Asthma    BPH (benign prostatic hyperplasia)    CAD (coronary artery disease)    CABG in 2008 with LIMA to LAD, SVG to Diag., SVG to OM1 and 2, SVG to PDA and PLOM, angioplasty x2 in 1990s, Dr. Tamala Julian.   Constipation    Coronary atherosclerosis of native coronary artery    Diabetes mellitus type 2, uncontrolled 11/29/2012   ED (erectile dysfunction)    Essential hypertension, benign    Good control   Hemorrhoids    Hyperlipidemia    Good control   IBS (irritable bowel syndrome)    Hx of, with constipation    Past Surgical History:  Procedure Laterality Date   CORONARY ANGIOPLASTY WITH STENT PLACEMENT  1990s   Angioplasty x2 in 1990s, Dr. Tamala Julian.   CORONARY ARTERY BYPASS GRAFT  2008   With LIMA to LAD, SVG to Diag., SVG to OM1 and 2, SVG to PDA and PLOM, angioplasty x2 in 1990s, Dr. Tamala Julian.   TONSILLECTOMY     teenager    Current Medications: Current Meds   Medication Sig   albuterol (VENTOLIN HFA) 108 (90 Base) MCG/ACT inhaler TAKE 2 PUFFS BY MOUTH EVERY 4 HOURS AS NEEDED   aspirin 81 MG tablet Take 81 mg by mouth daily.    Azelastine-Fluticasone 137-50 MCG/ACT SUSP PLACE 1 SPRAY INTO THE NOSE 2 (TWO) TIMES DAILY.   beclomethasone (QVAR) 80 MCG/ACT inhaler Inhale 2 puffs into the lungs 2 (two) times daily.   clobetasol (TEMOVATE) 0.05 % external solution Apply 1 application topically daily as needed (DANRUFF).    EPINEPHrine 0.3 mg/0.3 mL IJ SOAJ injection Inject 0.3 mg into the muscle as needed for anaphylaxis.   glucose blood test strip 1 each by Other route as needed for other (VERIGOLD). Use as instructed   indapamide (LOZOL) 1.25 MG tablet TAKE 1 TABLET BY MOUTH EVERY DAY   KLOR-CON M20 20 MEQ tablet    Lancets MISC by Does not apply route.   metFORMIN (GLUCOPHAGE-XR) 500 MG 24 hr tablet Take 500 mg by mouth daily.   metoprolol succinate (TOPROL-XL) 50 MG 24 hr tablet TAKE 1 TABLET BY MOUTH EVERY DAY   montelukast (SINGULAIR) 10 MG tablet Take 1 tablet (10 mg total) by mouth at bedtime.   Multiple Vitamins-Minerals (MULTIVITAMIN PO) Take 1 tablet by mouth daily.    nitroGLYCERIN (  NITROSTAT) 0.4 MG SL tablet Place 0.4 mg under the tongue every 5 (five) minutes as needed for chest pain.   pravastatin (PRAVACHOL) 40 MG tablet Take 40 mg by mouth daily.   sertraline (ZOLOFT) 25 MG tablet Take 25 mg by mouth daily.   valsartan (DIOVAN) 320 MG tablet Take 1 tablet (320 mg total) by mouth daily.     Allergies:   Elemental sulfur and Sulfa antibiotics   Social History   Socioeconomic History   Marital status: Married    Spouse name: Not on file   Number of children: Not on file   Years of education: Not on file   Highest education level: Not on file  Occupational History   Not on file  Tobacco Use   Smoking status: Former    Types: Cigarettes    Quit date: 10/07/1967    Years since quitting: 54.2   Smokeless tobacco: Never  Vaping  Use   Vaping Use: Never used  Substance and Sexual Activity   Alcohol use: Yes    Comment: wine with evening meal occasionally   Drug use: No   Sexual activity: Not Currently  Other Topics Concern   Not on file  Social History Narrative   Not on file   Social Determinants of Health   Financial Resource Strain: Not on file  Food Insecurity: Not on file  Transportation Needs: Not on file  Physical Activity: Not on file  Stress: Not on file  Social Connections: Not on file     Family History: The patient's family history includes Asthma in his mother; CAD in his father; Colon cancer in his maternal aunt; Heart attack in his father.  ROS:   Please see the history of present illness.    He is compliant with medications but is disturbed that his cholesterol and hemoglobin A1c are not as well-controlled.  He also notices some increase in blood pressures.  He does admit that he is not exercising as much and is unable to control sodium in his diet as well as he could previously.  All other systems reviewed and are negative.  EKGs/Labs/Other Studies Reviewed:    The following studies were reviewed today:  2D Doppler echocardiogram 12/07/2020: IMPRESSIONS   1. Left ventricular ejection fraction, by estimation, is 60 to 65%. The  left ventricle has normal function. The left ventricle has no regional  wall motion abnormalities. There is mild concentric left ventricular  hypertrophy. Left ventricular diastolic  parameters were normal.   2. Right ventricular systolic function is normal. The right ventricular  size is normal.   3. Left atrial size was mildly dilated.   4. Right atrial size was mildly dilated.   5. The mitral valve is normal in structure. Mild mitral valve  regurgitation. No evidence of mitral stenosis.   6. The aortic valve is normal in structure. Aortic valve regurgitation is  mild. Mild aortic valve sclerosis is present, with no evidence of aortic  valve stenosis.    7. The inferior vena cava is normal in size with greater than 50%  respiratory variability, suggesting right atrial pressure of 3 mmHg.   EKG:  EKG normal sinus rhythm, nonspecific T wave flattening, prominent voltage.  When compared to the prior tracing performed November 23, 2020, the heart rate is slightly faster on today's tracing.  Recent Labs: No results found for requested labs within last 8760 hours.  Recent Lipid Panel    Component Value Date/Time   CHOL 139 10/17/2020  0850   TRIG 127 10/17/2020 0850   HDL 52 10/17/2020 0850   CHOLHDL 2.7 10/17/2020 0850   CHOLHDL 2.7 09/26/2016 1015   VLDL 23 09/26/2016 1015   LDLCALC 65 10/17/2020 0850    Physical Exam:    VS:  BP (!) 148/80    Pulse 61    Ht 5\' 7"  (1.702 m)    Wt 168 lb 9.6 oz (76.5 kg)    SpO2 98%    BMI 26.41 kg/m     Wt Readings from Last 3 Encounters:  12/27/21 168 lb 9.6 oz (76.5 kg)  05/19/21 172 lb 12.8 oz (78.4 kg)  11/23/20 170 lb (77.1 kg)     GEN: Appears younger than stated age. No acute distress HEENT: Normal NECK: No JVD. LYMPHATICS: No lymphadenopathy CARDIAC: No murmur. RRR S4 but no S3 gallop, or edema. VASCULAR:  Normal Pulses. No bruits. RESPIRATORY:  Clear to auscultation without rales, wheezing or rhonchi  ABDOMEN: Soft, non-tender, non-distended, No pulsatile mass, MUSCULOSKELETAL: No deformity  SKIN: Warm and dry NEUROLOGIC:  Alert and oriented x 3 PSYCHIATRIC:  Normal affect   ASSESSMENT:    1. Coronary artery disease involving coronary bypass graft of native heart without angina pectoris   2. Other hyperlipidemia   3. Essential hypertension   4. Murmur    PLAN:    In order of problems listed above:  Secondary prevention discussed.  LDL target less than 70.  Systolic blood pressure less than 130.  Hemoglobin A1c less than 7 no targets.  Consider SGLT2 therapy. Continue current dose of statin, Pravachol 40 mg/day.  May need to switch to a more potent statin. 2 g sodium diet  recommended.  This will be difficult to achieve since he has to rely on prepared meals.  I discussed the DASH diet with the patient. Related to aortic valve.  He has known aortic valve sclerosis on 12/07/2020.   Overall education and awareness concerning secondary risk prevention was discussed in detail: LDL less than 70, hemoglobin A1c less than 7, blood pressure target less than 130/80 mmHg, >150 minutes of moderate aerobic activity per week, avoidance of smoking, weight control (via diet and exercise), and continued surveillance/management of/for obstructive sleep apnea.  Monitor blood pressure at home.  Let us know if he has consistent elevation above 275 mmHg systolic.  Medication Adjustments/Labs and Tests Ordered: Current medicines are reviewed at length with the patient today.  Concerns regarding medicines are outlined above.  Orders Placed This Encounter  Procedures   EKG 12-Lead   No orders of the defined types were placed in this encounter.   Patient Instructions  Medication Instructions:  Your physician recommends that you continue on your current medications as directed. Please refer to the Current Medication list given to you today.  *If you need a refill on your cardiac medications before your next appointment, please call your pharmacy*   Lab Work: None If you have labs (blood work) drawn today and your tests are completely normal, you will receive your results only by: [facility] (if you have MyChart) OR A paper copy in the mail If you have any lab test that is abnormal or we need to change your treatment, we will call you to review the results.   Testing/Procedures: None   Follow-Up: At [facility], you and your health needs are our priority.  As part of our continuing mission to provide you with exceptional heart care, we have created designated Provider Care Teams.  These Care Teams include your primary Cardiologist (physician) and Advanced Practice  Providers (APPs -  Physician Assistants and Nurse Practitioners) who all work together to provide you with the care you need, when you need it.  We recommend signing up for the patient portal called "MyChart".  Sign up information is provided on this After Visit Summary.  MyChart is used to connect with patients for Virtual Visits (Telemedicine).  Patients are able to view lab/test results, encounter notes, upcoming appointments, etc.  Non-urgent messages can be sent to your provider as well.   To learn more about what you can do with MyChart, go to [email or web address].    Your next appointment:   1 year(s)  The format for your next appointment:   In Person  Provider:   Sinclair Grooms, MD     Other Instructions Monitor your blood pressure 2-3 times a month.  If you notice your top number is consistently above 140, please let us know.    Signed, Sinclair Grooms, MD  12/27/2021 4:59 PM    [facility]

## 2022-01-04 ENCOUNTER — Other Ambulatory Visit: Payer: Self-pay

## 2022-01-04 ENCOUNTER — Ambulatory Visit (INDEPENDENT_AMBULATORY_CARE_PROVIDER_SITE_OTHER): Payer: Medicare Other

## 2022-01-04 DIAGNOSIS — J309 Allergic rhinitis, unspecified: Secondary | ICD-10-CM | POA: Diagnosis not present

## 2022-01-09 ENCOUNTER — Ambulatory Visit (INDEPENDENT_AMBULATORY_CARE_PROVIDER_SITE_OTHER): Payer: Medicare Other

## 2022-01-09 ENCOUNTER — Other Ambulatory Visit: Payer: Self-pay

## 2022-01-09 DIAGNOSIS — J309 Allergic rhinitis, unspecified: Secondary | ICD-10-CM | POA: Diagnosis not present

## 2022-01-18 ENCOUNTER — Other Ambulatory Visit: Payer: Self-pay

## 2022-01-18 ENCOUNTER — Ambulatory Visit (INDEPENDENT_AMBULATORY_CARE_PROVIDER_SITE_OTHER): Payer: Medicare Other

## 2022-01-18 DIAGNOSIS — J309 Allergic rhinitis, unspecified: Secondary | ICD-10-CM

## 2022-01-19 DIAGNOSIS — I10 Essential (primary) hypertension: Secondary | ICD-10-CM | POA: Diagnosis not present

## 2022-01-19 DIAGNOSIS — E7800 Pure hypercholesterolemia, unspecified: Secondary | ICD-10-CM | POA: Diagnosis not present

## 2022-01-19 DIAGNOSIS — E1169 Type 2 diabetes mellitus with other specified complication: Secondary | ICD-10-CM | POA: Diagnosis not present

## 2022-02-13 ENCOUNTER — Ambulatory Visit (INDEPENDENT_AMBULATORY_CARE_PROVIDER_SITE_OTHER): Payer: Medicare Other

## 2022-02-13 DIAGNOSIS — J309 Allergic rhinitis, unspecified: Secondary | ICD-10-CM

## 2022-03-19 ENCOUNTER — Other Ambulatory Visit: Payer: Self-pay | Admitting: Family Medicine

## 2022-03-19 ENCOUNTER — Other Ambulatory Visit: Payer: Self-pay | Admitting: Allergy

## 2022-03-19 DIAGNOSIS — H1010 Acute atopic conjunctivitis, unspecified eye: Secondary | ICD-10-CM

## 2022-03-27 ENCOUNTER — Ambulatory Visit (INDEPENDENT_AMBULATORY_CARE_PROVIDER_SITE_OTHER): Payer: Medicare Other

## 2022-03-27 DIAGNOSIS — J309 Allergic rhinitis, unspecified: Secondary | ICD-10-CM

## 2022-04-05 DIAGNOSIS — E785 Hyperlipidemia, unspecified: Secondary | ICD-10-CM | POA: Diagnosis not present

## 2022-05-08 DIAGNOSIS — I10 Essential (primary) hypertension: Secondary | ICD-10-CM | POA: Diagnosis not present

## 2022-05-08 DIAGNOSIS — E1122 Type 2 diabetes mellitus with diabetic chronic kidney disease: Secondary | ICD-10-CM | POA: Diagnosis not present

## 2022-05-08 DIAGNOSIS — N400 Enlarged prostate without lower urinary tract symptoms: Secondary | ICD-10-CM | POA: Diagnosis not present

## 2022-05-08 DIAGNOSIS — N182 Chronic kidney disease, stage 2 (mild): Secondary | ICD-10-CM | POA: Diagnosis not present

## 2022-05-15 ENCOUNTER — Ambulatory Visit (INDEPENDENT_AMBULATORY_CARE_PROVIDER_SITE_OTHER): Payer: Medicare Other

## 2022-05-15 DIAGNOSIS — J309 Allergic rhinitis, unspecified: Secondary | ICD-10-CM | POA: Diagnosis not present

## 2022-05-22 ENCOUNTER — Ambulatory Visit (INDEPENDENT_AMBULATORY_CARE_PROVIDER_SITE_OTHER): Payer: Medicare Other

## 2022-05-22 DIAGNOSIS — J309 Allergic rhinitis, unspecified: Secondary | ICD-10-CM | POA: Diagnosis not present

## 2022-05-31 DIAGNOSIS — J3089 Other allergic rhinitis: Secondary | ICD-10-CM | POA: Diagnosis not present

## 2022-05-31 NOTE — Progress Notes (Signed)
VIAL EXP 06-01-23

## 2022-06-14 ENCOUNTER — Other Ambulatory Visit: Payer: Self-pay | Admitting: Allergy

## 2022-06-14 ENCOUNTER — Ambulatory Visit: Payer: Medicare Other | Admitting: Allergy

## 2022-06-19 ENCOUNTER — Ambulatory Visit (INDEPENDENT_AMBULATORY_CARE_PROVIDER_SITE_OTHER): Payer: Medicare Other

## 2022-06-19 DIAGNOSIS — J309 Allergic rhinitis, unspecified: Secondary | ICD-10-CM | POA: Diagnosis not present

## 2022-06-28 ENCOUNTER — Ambulatory Visit: Payer: Medicare Other | Admitting: Allergy

## 2022-07-04 ENCOUNTER — Encounter: Payer: Self-pay | Admitting: Allergy

## 2022-07-04 ENCOUNTER — Ambulatory Visit (INDEPENDENT_AMBULATORY_CARE_PROVIDER_SITE_OTHER): Payer: Medicare Other | Admitting: Allergy

## 2022-07-04 DIAGNOSIS — J4520 Mild intermittent asthma, uncomplicated: Secondary | ICD-10-CM | POA: Diagnosis not present

## 2022-07-04 DIAGNOSIS — H1013 Acute atopic conjunctivitis, bilateral: Secondary | ICD-10-CM

## 2022-07-04 DIAGNOSIS — J309 Allergic rhinitis, unspecified: Secondary | ICD-10-CM | POA: Diagnosis not present

## 2022-07-04 DIAGNOSIS — I25810 Atherosclerosis of coronary artery bypass graft(s) without angina pectoris: Secondary | ICD-10-CM | POA: Diagnosis not present

## 2022-07-04 DIAGNOSIS — H1010 Acute atopic conjunctivitis, unspecified eye: Secondary | ICD-10-CM

## 2022-07-04 MED ORDER — ALBUTEROL SULFATE HFA 108 (90 BASE) MCG/ACT IN AERS: INHALATION_SPRAY | RESPIRATORY_TRACT | 1 refills | Status: DC

## 2022-07-04 MED ORDER — QVAR REDIHALER 80 MCG/ACT IN AERB: 2.0000 | INHALATION_SPRAY | Freq: Two times a day (BID) | RESPIRATORY_TRACT | 2 refills | Status: DC

## 2022-07-04 MED ORDER — MONTELUKAST SODIUM 10 MG PO TABS: ORAL_TABLET | ORAL | 0 refills | Status: DC

## 2022-07-04 MED ORDER — AZELASTINE-FLUTICASONE 137-50 MCG/ACT NA SUSP: NASAL | 1 refills | Status: DC

## 2022-07-04 NOTE — Patient Instructions (Signed)
Mild persistent asthma - Well-controlled.  Lung function testing looks great today! - Continue Singulair '10mg'$  at bedtime - Continue albuterol 2 puffs every 4-6 hours as needed for cough, wheeze, shortness of breath - Asthma action plan: use Qvar 80 g 2 puffs twice a day during respiratory illness/asthma exacerbation Asthma control goals:  Full participation in all desired activities (may need albuterol before activity) Albuterol use two time or less a week on average (not counting use with activity) Cough interfering with sleep two time or less a month Oral steroids no more than once a year No hospitalizations   Allergic rhinoconjunctivitis - Continue avoidance: Weeds, Dust Mite and Mold.       - Claritin '10mg'$  by mouth once daily for runny nose or itching as needed. - Continue Dymista 1-2 spray(s) each nostril daily as needed for stuffy nose or drainage. - Continue monthly allergen immunotherapy for now will plan to re-evaluate next year        -Epi-pen/Benadryl as needed.   Follow up Visit: 12 months or sooner if needed.

## 2022-07-04 NOTE — Progress Notes (Signed)
Follow-up Note  RE: [name] [name] MRN: [id number] DOB: 1941/07/15 Date of Office Visit: 07/04/2022   History of present illness: [name] [name] is a 81 y.o. male presenting today for follow-up of   Review of systems: Review of Systems  Constitutional: Negative.   HENT: Negative.    Eyes: Negative.   Respiratory: Negative.    Cardiovascular: Negative.   Musculoskeletal: Negative.   Skin: Negative.   Allergic/Immunologic: Negative.   Neurological: Negative.      All other systems negative unless noted above in HPI  Past medical/social/surgical/family history have been reviewed and are unchanged unless specifically indicated below.  No changes  Medication List: Current Outpatient Medications  Medication Sig Dispense Refill   aspirin 81 MG tablet Take 81 mg by mouth daily.      beclomethasone (QVAR REDIHALER) 80 MCG/ACT inhaler Inhale 2 puffs into the lungs 2 (two) times daily. 1 each 2   clobetasol (TEMOVATE) 0.05 % external solution Apply 1 application topically daily as needed (DANRUFF).      EPINEPHrine 0.3 mg/0.3 mL IJ SOAJ injection Inject 0.3 mg into the muscle as needed for anaphylaxis. 1 each 1   glucose blood test strip 1 each by Other route as needed for other (VERIGOLD). Use as instructed     indapamide (LOZOL) 1.25 MG tablet TAKE 1 TABLET BY MOUTH EVERY DAY 90 tablet 0   KLOR-CON M20 20 MEQ tablet      Lancets MISC by Does not apply route.     metFORMIN (GLUCOPHAGE-XR) 500 MG 24 hr tablet Take 500 mg by mouth daily.     metoprolol succinate (TOPROL-XL) 50 MG 24 hr tablet TAKE 1 TABLET BY MOUTH EVERY DAY 30 tablet 0   Multiple Vitamins-Minerals (MULTIVITAMIN PO) Take 1 tablet by mouth daily.      nitroGLYCERIN (NITROSTAT) 0.4 MG SL tablet Place 0.4 mg under the tongue every 5 (five) minutes as needed for chest pain.     pravastatin (PRAVACHOL) 40 MG tablet Take 40 mg by mouth daily.  3   sertraline (ZOLOFT) 25 MG tablet Take 25 mg by mouth daily.     valsartan  (DIOVAN) 320 MG tablet Take 1 tablet (320 mg total) by mouth daily. 90 tablet 3   albuterol (VENTOLIN HFA) 108 (90 Base) MCG/ACT inhaler TAKE 2 PUFFS BY MOUTH EVERY 4 HOURS AS NEEDED 18 g 1   Azelastine-Fluticasone 137-50 MCG/ACT SUSP INSTILL 1 SPRAY INTO THE NOSTRIL 2 TIMES DAILY 23 g 1   montelukast (SINGULAIR) 10 MG tablet TAKE 1 TABLET BY MOUTH EVERYDAY AT BEDTIME 30 tablet 0   No current facility-administered medications for this visit.     Known medication allergies: Allergies  Allergen Reactions   Elemental Sulfur Rash   Sulfa Antibiotics Rash          Physical examination: Blood pressure (!) 140/82, pulse 63, temperature 98.9 F (37.2 C), resp. rate 16, height '5\' 7"'$  (1.702 m), weight 173 lb 8 oz (78.7 kg), SpO2 97 %.  General: Alert, interactive, in no acute distress. HEENT: PERRLA, TMs pearly gray, turbinates non-edematous without discharge, post-pharynx non erythematous. Neck: Supple without lymphadenopathy. Lungs: Clear to auscultation without wheezing, rhonchi or rales. {no increased work of breathing. CV: Normal S1, S2 without murmurs. Abdomen: Nondistended, nontender. Skin: Warm and dry, without lesions or rashes. Extremities:  No clubbing, cyanosis or edema. Neuro:   Grossly intact.  Diagnositics/Labs: Spirometry: FEV1: 2.09L 93%, FVC: 2.93L 98%, ratio consistent with nonobstructive pattern  Assessment and  plan: Mild persistent asthma - Well-controlled.  Lung function testing looks great today! - Continue Singulair '10mg'$  at bedtime - Continue albuterol 2 puffs every 4-6 hours as needed for cough, wheeze, shortness of breath - Asthma action plan: use Qvar 80 g 2 puffs twice a day during respiratory illness/asthma exacerbation Asthma control goals:  Full participation in all desired activities (may need albuterol before activity) Albuterol use two time or less a week on average (not counting use with activity) Cough interfering with sleep two time or less a  month Oral steroids no more than once a year No hospitalizations   Allergic rhinoconjunctivitis - Continue avoidance: Weeds, Dust Mite and Mold.       - Claritin '10mg'$  by mouth once daily for runny nose or itching as needed. - Continue Dymista 1-2 spray(s) each nostril daily as needed for stuffy nose or drainage. - Continue monthly allergen immunotherapy for now will plan to re-evaluate next year        -Epi-pen/Benadryl as needed.   Follow up Visit: 12 months or sooner if needed.  I appreciate the opportunity to take part in [name] [name]'s care. Please do not hesitate to contact me with questions.  Sincerely,   Prudy Feeler, MD Allergy/Immunology [facility]

## 2022-07-06 ENCOUNTER — Other Ambulatory Visit: Payer: Self-pay | Admitting: Allergy

## 2022-07-17 ENCOUNTER — Ambulatory Visit (INDEPENDENT_AMBULATORY_CARE_PROVIDER_SITE_OTHER): Payer: Medicare Other

## 2022-07-17 DIAGNOSIS — J309 Allergic rhinitis, unspecified: Secondary | ICD-10-CM | POA: Diagnosis not present

## 2022-07-27 DIAGNOSIS — Z23 Encounter for immunization: Secondary | ICD-10-CM | POA: Diagnosis not present

## 2022-08-21 ENCOUNTER — Ambulatory Visit (INDEPENDENT_AMBULATORY_CARE_PROVIDER_SITE_OTHER): Payer: Medicare Other

## 2022-08-21 DIAGNOSIS — J309 Allergic rhinitis, unspecified: Secondary | ICD-10-CM | POA: Diagnosis not present

## 2022-08-28 DIAGNOSIS — N401 Enlarged prostate with lower urinary tract symptoms: Secondary | ICD-10-CM | POA: Diagnosis not present

## 2022-08-28 DIAGNOSIS — R351 Nocturia: Secondary | ICD-10-CM | POA: Diagnosis not present

## 2022-08-30 ENCOUNTER — Ambulatory Visit (INDEPENDENT_AMBULATORY_CARE_PROVIDER_SITE_OTHER): Payer: Medicare Other

## 2022-08-30 DIAGNOSIS — J309 Allergic rhinitis, unspecified: Secondary | ICD-10-CM

## 2022-09-04 ENCOUNTER — Ambulatory Visit (INDEPENDENT_AMBULATORY_CARE_PROVIDER_SITE_OTHER): Payer: Medicare Other

## 2022-09-04 DIAGNOSIS — J309 Allergic rhinitis, unspecified: Secondary | ICD-10-CM

## 2022-09-13 ENCOUNTER — Ambulatory Visit (INDEPENDENT_AMBULATORY_CARE_PROVIDER_SITE_OTHER): Payer: Medicare Other

## 2022-09-13 DIAGNOSIS — J309 Allergic rhinitis, unspecified: Secondary | ICD-10-CM

## 2022-09-14 DIAGNOSIS — E119 Type 2 diabetes mellitus without complications: Secondary | ICD-10-CM | POA: Diagnosis not present

## 2022-09-14 DIAGNOSIS — H524 Presbyopia: Secondary | ICD-10-CM | POA: Diagnosis not present

## 2022-09-14 DIAGNOSIS — H40013 Open angle with borderline findings, low risk, bilateral: Secondary | ICD-10-CM | POA: Diagnosis not present

## 2022-09-14 DIAGNOSIS — H25813 Combined forms of age-related cataract, bilateral: Secondary | ICD-10-CM | POA: Diagnosis not present

## 2022-09-18 ENCOUNTER — Ambulatory Visit (INDEPENDENT_AMBULATORY_CARE_PROVIDER_SITE_OTHER): Payer: Medicare Other

## 2022-09-18 DIAGNOSIS — J309 Allergic rhinitis, unspecified: Secondary | ICD-10-CM

## 2022-09-30 ENCOUNTER — Encounter: Payer: Self-pay | Admitting: Allergy

## 2022-10-01 ENCOUNTER — Other Ambulatory Visit: Payer: Self-pay | Admitting: *Deleted

## 2022-10-01 ENCOUNTER — Encounter: Payer: Self-pay | Admitting: Interventional Cardiology

## 2022-10-01 DIAGNOSIS — H1010 Acute atopic conjunctivitis, unspecified eye: Secondary | ICD-10-CM

## 2022-10-01 MED ORDER — AZELASTINE-FLUTICASONE 137-50 MCG/ACT NA SUSP: NASAL | 5 refills | Status: DC

## 2022-10-30 ENCOUNTER — Ambulatory Visit (INDEPENDENT_AMBULATORY_CARE_PROVIDER_SITE_OTHER): Payer: Medicare Other | Admitting: *Deleted

## 2022-10-30 DIAGNOSIS — J309 Allergic rhinitis, unspecified: Secondary | ICD-10-CM

## 2022-11-06 ENCOUNTER — Ambulatory Visit (INDEPENDENT_AMBULATORY_CARE_PROVIDER_SITE_OTHER): Payer: Medicare Other

## 2022-11-06 DIAGNOSIS — J309 Allergic rhinitis, unspecified: Secondary | ICD-10-CM

## 2022-11-22 DIAGNOSIS — N400 Enlarged prostate without lower urinary tract symptoms: Secondary | ICD-10-CM | POA: Diagnosis not present

## 2022-11-22 DIAGNOSIS — I10 Essential (primary) hypertension: Secondary | ICD-10-CM | POA: Diagnosis not present

## 2022-11-22 DIAGNOSIS — Z79899 Other long term (current) drug therapy: Secondary | ICD-10-CM | POA: Diagnosis not present

## 2022-11-22 DIAGNOSIS — E7800 Pure hypercholesterolemia, unspecified: Secondary | ICD-10-CM | POA: Diagnosis not present

## 2022-11-22 DIAGNOSIS — Z1331 Encounter for screening for depression: Secondary | ICD-10-CM | POA: Diagnosis not present

## 2022-11-22 DIAGNOSIS — E1169 Type 2 diabetes mellitus with other specified complication: Secondary | ICD-10-CM | POA: Diagnosis not present

## 2022-11-22 DIAGNOSIS — I2510 Atherosclerotic heart disease of native coronary artery without angina pectoris: Secondary | ICD-10-CM | POA: Diagnosis not present

## 2022-11-22 DIAGNOSIS — Z0000 Encounter for general adult medical examination without abnormal findings: Secondary | ICD-10-CM | POA: Diagnosis not present

## 2022-11-22 DIAGNOSIS — J4520 Mild intermittent asthma, uncomplicated: Secondary | ICD-10-CM | POA: Diagnosis not present

## 2022-11-22 DIAGNOSIS — Z1211 Encounter for screening for malignant neoplasm of colon: Secondary | ICD-10-CM | POA: Diagnosis not present

## 2022-11-22 DIAGNOSIS — N182 Chronic kidney disease, stage 2 (mild): Secondary | ICD-10-CM | POA: Diagnosis not present

## 2022-11-22 DIAGNOSIS — E1122 Type 2 diabetes mellitus with diabetic chronic kidney disease: Secondary | ICD-10-CM | POA: Diagnosis not present

## 2022-11-22 DIAGNOSIS — N5201 Erectile dysfunction due to arterial insufficiency: Secondary | ICD-10-CM | POA: Diagnosis not present

## 2022-11-25 ENCOUNTER — Other Ambulatory Visit: Payer: Self-pay | Admitting: Allergy

## 2022-12-10 DIAGNOSIS — Z1212 Encounter for screening for malignant neoplasm of rectum: Secondary | ICD-10-CM | POA: Diagnosis not present

## 2022-12-10 DIAGNOSIS — Z1211 Encounter for screening for malignant neoplasm of colon: Secondary | ICD-10-CM | POA: Diagnosis not present

## 2022-12-10 DIAGNOSIS — J3089 Other allergic rhinitis: Secondary | ICD-10-CM | POA: Diagnosis not present

## 2022-12-10 NOTE — Progress Notes (Signed)
VIALS EXP 12-11-23

## 2022-12-11 ENCOUNTER — Ambulatory Visit (INDEPENDENT_AMBULATORY_CARE_PROVIDER_SITE_OTHER): Payer: Medicare Other

## 2022-12-11 DIAGNOSIS — J309 Allergic rhinitis, unspecified: Secondary | ICD-10-CM

## 2022-12-26 ENCOUNTER — Ambulatory Visit: Payer: Medicare Other | Admitting: Internal Medicine

## 2022-12-27 DIAGNOSIS — Z23 Encounter for immunization: Secondary | ICD-10-CM | POA: Diagnosis not present

## 2022-12-27 DIAGNOSIS — I10 Essential (primary) hypertension: Secondary | ICD-10-CM | POA: Diagnosis not present

## 2022-12-27 DIAGNOSIS — F439 Reaction to severe stress, unspecified: Secondary | ICD-10-CM | POA: Diagnosis not present

## 2023-01-06 ENCOUNTER — Other Ambulatory Visit: Payer: Self-pay | Admitting: Allergy

## 2023-01-06 DIAGNOSIS — J4520 Mild intermittent asthma, uncomplicated: Secondary | ICD-10-CM

## 2023-01-08 ENCOUNTER — Encounter: Payer: Self-pay | Admitting: Internal Medicine

## 2023-01-08 ENCOUNTER — Ambulatory Visit: Payer: Medicare Other | Attending: Internal Medicine | Admitting: Internal Medicine

## 2023-01-08 VITALS — BP 156/76 | HR 55 | Ht 67.0 in | Wt 171.0 lb

## 2023-01-08 DIAGNOSIS — I25810 Atherosclerosis of coronary artery bypass graft(s) without angina pectoris: Secondary | ICD-10-CM

## 2023-01-08 MED ORDER — DAPAGLIFLOZIN PROPANEDIOL 10 MG PO TABS: 10.0000 mg | ORAL_TABLET | Freq: Every day | ORAL | 5 refills | Status: DC

## 2023-01-08 NOTE — Progress Notes (Signed)
Cardiology Office Note:    Date:  01/08/2023   ID:  [name] [name], DOB 20-Aug-1941, MRN [id number]  PCP:  Lavone Orn, MD  Cardiologist:  Sinclair Grooms, MD (Inactive)   Referring MD: Lavone Orn, MD   No chief complaint on file.   History of Present Illness:    [name] [name] is a 82 y.o. male with a hx of CABG 2008 , hypertension, DM2, and hyperlipidemia.   He is under significant stress related to his wife's cognitive impairment, the time required to adequately care for her, the difficulty with having the prepared meals since his wife is unable to cook, and difficulty sleeping because of nocturia.  He denies angina.  No palpitations.  He has not had syncope.  He has not been as able to freely exercise because he has concerns about leaving his wife alone at home.  There have been no symptoms that causes him to have concern for changing cardiac status.  Interim hx 01/08/2023 Former patient of Dr. Tamala Julian, his notes are above. He denies CP or SOB. He is walking more. He checks his Bps at home,up to 170s , prior to his medications. Was just prescribed norvasc 5 mg daily; has not started it yet. He continues to take care of his wife with dementia.  Past Medical History:  Diagnosis Date   Allergic rhinitis    Immunotherapy, Bardelas   Asthma    BPH (benign prostatic hyperplasia)    CAD (coronary artery disease)    CABG in 2008 with LIMA to LAD, SVG to Diag., SVG to OM1 and 2, SVG to PDA and PLOM, angioplasty x2 in 1990s, Dr. Tamala Julian.   Constipation    Coronary atherosclerosis of native coronary artery    Diabetes mellitus type 2, uncontrolled 11/29/2012   ED (erectile dysfunction)    Essential hypertension, benign    Good control   Hemorrhoids    Hyperlipidemia    Good control   IBS (irritable bowel syndrome)    Hx of, with constipation    Past Surgical History:  Procedure Laterality Date   CORONARY ANGIOPLASTY WITH STENT PLACEMENT  1990s   Angioplasty x2 in 1990s, Dr.  Tamala Julian.   CORONARY ARTERY BYPASS GRAFT  2008   With LIMA to LAD, SVG to Diag., SVG to OM1 and 2, SVG to PDA and PLOM, angioplasty x2 in 1990s, Dr. Tamala Julian.   TONSILLECTOMY     teenager    Current Medications: No outpatient medications have been marked as taking for the 01/08/23 encounter (Appointment) with Janina Mayo, MD.     Allergies:   Elemental sulfur and Sulfa antibiotics   Social History   Socioeconomic History   Marital status: Married    Spouse name: Not on file   Number of children: Not on file   Years of education: Not on file   Highest education level: Not on file  Occupational History   Not on file  Tobacco Use   Smoking status: Former    Types: Cigarettes    Quit date: 10/07/1967    Years since quitting: 55.2   Smokeless tobacco: Never  Vaping Use   Vaping Use: Never used  Substance and Sexual Activity   Alcohol use: Yes    Comment: wine with evening meal occasionally   Drug use: No   Sexual activity: Not Currently  Other Topics Concern   Not on file  Social History Narrative   Not on file   Social Determinants of Health  Financial Resource Strain: Not on file  Food Insecurity: Not on file  Transportation Needs: Not on file  Physical Activity: Not on file  Stress: Not on file  Social Connections: Not on file     Family History: The patient's family history includes Asthma in his mother; CAD in his father; Colon cancer in his maternal aunt; Heart attack in his father.  ROS:   Please see the history of present illness.    He is compliant with medications but is disturbed that his cholesterol and hemoglobin A1c are not as well-controlled.  He also notices some increase in blood pressures.  He does admit that he is not exercising as much and is unable to control sodium in his diet as well as he could previously.  All other systems reviewed and are negative.  EKGs/Labs/Other Studies Reviewed:    The following studies were reviewed today:  2D  Doppler echocardiogram 12/07/2020: IMPRESSIONS   1. Left ventricular ejection fraction, by estimation, is 60 to 65%. The  left ventricle has normal function. The left ventricle has no regional  wall motion abnormalities. There is mild concentric left ventricular  hypertrophy. Left ventricular diastolic  parameters were normal.   2. Right ventricular systolic function is normal. The right ventricular  size is normal.   3. Left atrial size was mildly dilated.   4. Right atrial size was mildly dilated.   5. The mitral valve is normal in structure. Mild mitral valve  regurgitation. No evidence of mitral stenosis.   6. The aortic valve is normal in structure. Aortic valve regurgitation is  mild. Mild aortic valve sclerosis is present, with no evidence of aortic  valve stenosis.   7. The inferior vena cava is normal in size with greater than 50%  respiratory variability, suggesting right atrial pressure of 3 mmHg.   EKG:  EKG normal sinus rhythm, nonspecific T wave flattening, prominent voltage.  When compared to the prior tracing performed November 23, 2020, the heart rate is slightly faster on today's tracing.  EKG 01/08/2023- sinus bradycardia, no ischemic changes  Recent Labs: No results found for requested labs within last 365 days.   Recent Lipid Panel    Component Value Date/Time   CHOL 139 10/17/2020 0850   TRIG 127 10/17/2020 0850   HDL 52 10/17/2020 0850   CHOLHDL 2.7 10/17/2020 0850   CHOLHDL 2.7 09/26/2016 1015   VLDL 23 09/26/2016 1015   LDLCALC 65 10/17/2020 0850    Physical Exam:    VS:   Vitals:   01/08/23 0930  BP: (!) 156/76  Pulse: (!) 55  SpO2: 98%     Wt Readings from Last 3 Encounters:  07/04/22 173 lb 8 oz (78.7 kg)  12/27/21 168 lb 9.6 oz (76.5 kg)  05/19/21 172 lb 12.8 oz (78.4 kg)     GEN: Appears younger than stated age. No acute distress HEENT: Normal NECK: No JVD. LYMPHATICS: No lymphadenopathy CARDIAC: No murmur. RRR S4 but no S3 gallop,  or edema. VASCULAR:  Normal Pulses. No bruits. RESPIRATORY:  Clear to auscultation without rales, wheezing or rhonchi  ABDOMEN: Soft, non-tender, non-distended, No pulsatile mass, MUSCULOSKELETAL: No deformity  SKIN: Warm and dry NEUROLOGIC:  Alert and oriented x 3 PSYCHIATRIC:  Normal affect   ASSESSMENT:    CAD: CABG 2008 - Dr. Tamala Julian discussed secondary prevention: LDL target less than 70.  Systolic blood pressure less than 130.  Hemoglobin A1c less than 7 no targets.   - LDL at goal  65 mg/dL; continue pravachol 40 mg daily - BP not well controlled. Continue valsartan 320 mg daily. Starting norvasc 5 mg daily, can uptitrate to 10 mg daily if BP not at goal < 130/80 mmHg. Discussed he can call us, if his home blood pressures are not at goal a few weeks after starting his medications - start farxiga 10 mg daily. A1c 7.7 - they discussed importance of diet/DASH diet  PLAN:    In order of problems listed above:  Start farxiga 10 mg daily Follow up 6 months  Medication Adjustments/Labs and Tests Ordered: Current medicines are reviewed at length with the patient today.  Concerns regarding medicines are outlined above.  No orders of the defined types were placed in this encounter.  No orders of the defined types were placed in this encounter.   There are no Patient Instructions on file for this visit.   Signed, Janina Mayo, MD  01/08/2023 7:53 AM    [facility]

## 2023-01-08 NOTE — Patient Instructions (Signed)
Medication Instructions:  START: FARXIGA '10mg'$  ONCE DAILY BEFORE BREAKFAST  *If you need a refill on your cardiac medications before your next appointment, please call your pharmacy*  Lab Work: None Ordered At This Time.  If you have labs (blood work) drawn today and your tests are completely normal, you will receive your results only by: [place] (if you have MyChart) OR A paper copy in the mail If you have any lab test that is abnormal or we need to change your treatment, we will call you to review the results.  Testing/Procedures: None Ordered At This Time.   Follow-Up: At [facility], you and your health needs are our priority.  As part of our continuing mission to provide you with exceptional heart care, we have created designated Provider Care Teams.  These Care Teams include your primary Cardiologist (physician) and Advanced Practice Providers (APPs -  Physician Assistants and Nurse Practitioners) who all work together to provide you with the care you need, when you need it.  Your next appointment:   6 month(s)  Provider:   Janina Mayo, MD

## 2023-01-10 ENCOUNTER — Ambulatory Visit (INDEPENDENT_AMBULATORY_CARE_PROVIDER_SITE_OTHER): Payer: Medicare Other

## 2023-01-10 DIAGNOSIS — J309 Allergic rhinitis, unspecified: Secondary | ICD-10-CM

## 2023-01-28 DIAGNOSIS — E1122 Type 2 diabetes mellitus with diabetic chronic kidney disease: Secondary | ICD-10-CM | POA: Diagnosis not present

## 2023-01-28 DIAGNOSIS — I10 Essential (primary) hypertension: Secondary | ICD-10-CM | POA: Diagnosis not present

## 2023-01-28 DIAGNOSIS — F439 Reaction to severe stress, unspecified: Secondary | ICD-10-CM | POA: Diagnosis not present

## 2023-02-05 ENCOUNTER — Ambulatory Visit (INDEPENDENT_AMBULATORY_CARE_PROVIDER_SITE_OTHER): Payer: Medicare Other

## 2023-02-05 DIAGNOSIS — J309 Allergic rhinitis, unspecified: Secondary | ICD-10-CM

## 2023-02-21 ENCOUNTER — Other Ambulatory Visit: Payer: Self-pay | Admitting: Allergy

## 2023-02-21 DIAGNOSIS — J4520 Mild intermittent asthma, uncomplicated: Secondary | ICD-10-CM

## 2023-03-05 ENCOUNTER — Ambulatory Visit (INDEPENDENT_AMBULATORY_CARE_PROVIDER_SITE_OTHER): Payer: Medicare Other

## 2023-03-05 DIAGNOSIS — J309 Allergic rhinitis, unspecified: Secondary | ICD-10-CM | POA: Diagnosis not present

## 2023-03-13 DIAGNOSIS — F439 Reaction to severe stress, unspecified: Secondary | ICD-10-CM | POA: Diagnosis not present

## 2023-03-13 DIAGNOSIS — I10 Essential (primary) hypertension: Secondary | ICD-10-CM | POA: Diagnosis not present

## 2023-03-14 ENCOUNTER — Ambulatory Visit (INDEPENDENT_AMBULATORY_CARE_PROVIDER_SITE_OTHER): Payer: Medicare Other

## 2023-03-14 DIAGNOSIS — J309 Allergic rhinitis, unspecified: Secondary | ICD-10-CM

## 2023-03-19 ENCOUNTER — Ambulatory Visit (INDEPENDENT_AMBULATORY_CARE_PROVIDER_SITE_OTHER): Payer: Medicare Other

## 2023-03-19 DIAGNOSIS — J309 Allergic rhinitis, unspecified: Secondary | ICD-10-CM | POA: Diagnosis not present

## 2023-03-26 ENCOUNTER — Ambulatory Visit (INDEPENDENT_AMBULATORY_CARE_PROVIDER_SITE_OTHER): Payer: Medicare Other

## 2023-03-26 DIAGNOSIS — J309 Allergic rhinitis, unspecified: Secondary | ICD-10-CM | POA: Diagnosis not present

## 2023-04-02 ENCOUNTER — Ambulatory Visit (INDEPENDENT_AMBULATORY_CARE_PROVIDER_SITE_OTHER): Payer: Medicare Other

## 2023-04-02 DIAGNOSIS — J309 Allergic rhinitis, unspecified: Secondary | ICD-10-CM | POA: Diagnosis not present

## 2023-04-09 DIAGNOSIS — L218 Other seborrheic dermatitis: Secondary | ICD-10-CM | POA: Diagnosis not present

## 2023-04-30 ENCOUNTER — Ambulatory Visit (INDEPENDENT_AMBULATORY_CARE_PROVIDER_SITE_OTHER): Payer: Medicare Other

## 2023-04-30 DIAGNOSIS — J309 Allergic rhinitis, unspecified: Secondary | ICD-10-CM

## 2023-05-28 ENCOUNTER — Ambulatory Visit: Payer: Medicare Other

## 2023-05-28 DIAGNOSIS — J309 Allergic rhinitis, unspecified: Secondary | ICD-10-CM | POA: Diagnosis not present

## 2023-07-02 ENCOUNTER — Ambulatory Visit (INDEPENDENT_AMBULATORY_CARE_PROVIDER_SITE_OTHER): Payer: Medicare Other | Admitting: *Deleted

## 2023-07-02 DIAGNOSIS — J309 Allergic rhinitis, unspecified: Secondary | ICD-10-CM

## 2023-07-03 ENCOUNTER — Encounter: Payer: Self-pay | Admitting: Allergy

## 2023-07-03 ENCOUNTER — Ambulatory Visit (INDEPENDENT_AMBULATORY_CARE_PROVIDER_SITE_OTHER): Payer: Medicare Other | Admitting: Allergy

## 2023-07-03 ENCOUNTER — Other Ambulatory Visit: Payer: Self-pay

## 2023-07-03 VITALS — BP 140/84 | HR 52 | Temp 97.6°F | Resp 18 | Ht 66.0 in

## 2023-07-03 DIAGNOSIS — H1013 Acute atopic conjunctivitis, bilateral: Secondary | ICD-10-CM

## 2023-07-03 DIAGNOSIS — J4520 Mild intermittent asthma, uncomplicated: Secondary | ICD-10-CM | POA: Diagnosis not present

## 2023-07-03 DIAGNOSIS — J309 Allergic rhinitis, unspecified: Secondary | ICD-10-CM

## 2023-07-03 DIAGNOSIS — H1010 Acute atopic conjunctivitis, unspecified eye: Secondary | ICD-10-CM

## 2023-07-03 MED ORDER — EPINEPHRINE 0.3 MG/0.3ML IJ SOAJ: 0.3000 mg | INTRAMUSCULAR | 1 refills | Status: DC | PRN

## 2023-07-03 MED ORDER — ALBUTEROL SULFATE HFA 108 (90 BASE) MCG/ACT IN AERS: INHALATION_SPRAY | RESPIRATORY_TRACT | 1 refills | Status: DC

## 2023-07-03 MED ORDER — QVAR REDIHALER 80 MCG/ACT IN AERB: 2.0000 | INHALATION_SPRAY | Freq: Two times a day (BID) | RESPIRATORY_TRACT | 2 refills | Status: DC

## 2023-07-03 MED ORDER — MONTELUKAST SODIUM 10 MG PO TABS: ORAL_TABLET | ORAL | 3 refills | Status: DC

## 2023-07-03 MED ORDER — AZELASTINE-FLUTICASONE 137-50 MCG/ACT NA SUSP: NASAL | 5 refills | Status: DC

## 2023-07-03 NOTE — Progress Notes (Signed)
Follow-up Note  RE: [name] [name] MRN: [id number] DOB: 03-24-1941 Date of Office Visit: 07/03/2023   History of present illness: [name] [name] is a 82 y.o. male presenting today for follow-up of asthma and allergic rhinitis with conjunctivitis.  He was last seen in the office on 07/04/2022 by myself.  He has not had any major health changes, surgeries or hospitalizations since the last visit. He states while at the beach he did have several days where he rode a bike did about 3 miles.  He enjoyed this activity.  She he states with his asthma that he has been doing well.  He continues Singulair nightly.  He did need to initiate Qvar earlier in the spring when he was noting more chest congestion and cough that likely had a URI.  He has not needed to use his albuterol since the last visit.  He has not had any ED or urgent care visits or any systemic steroid needs. With his allergies he continues on allergen immunotherapy at maintenance dosing once a month.  He states he has not needed to use Claritin antihistamine this year.  He does use the azelastine/fluticasone spray about once a week or so for symptom control.  He otherwise states this year has been about the same as last year in regards to his allergy symptoms.  His last allergy testing was 4 years ago.  Review of systems: 10pt ROS negative unless noted above in HPI  Past medical/social/surgical/family history have been reviewed and are unchanged unless specifically indicated below.  No changes  Medication List: Current Outpatient Medications  Medication Sig Dispense Refill   albuterol (VENTOLIN HFA) 108 (90 Base) MCG/ACT inhaler TAKE 2 PUFFS BY MOUTH EVERY 4 HOURS AS NEEDED 18 each 1   aspirin 81 MG tablet Take 81 mg by mouth daily.      Azelastine-Fluticasone 137-50 MCG/ACT SUSP INSTILL 1 SPRAY INTO THE NOSTRIL 2 TIMES DAILY 23 g 5   beclomethasone (QVAR REDIHALER) 80 MCG/ACT inhaler Inhale 2 puffs into the lungs 2 (two) times daily. 1  each 2   clobetasol (TEMOVATE) 0.05 % external solution Apply 1 application topically daily as needed (DANRUFF).      dapagliflozin propanediol (FARXIGA) 10 MG TABS tablet Take 1 tablet (10 mg total) by mouth daily before breakfast. 30 tablet 5   EPINEPHrine 0.3 mg/0.3 mL IJ SOAJ injection Inject 0.3 mg into the muscle as needed for anaphylaxis. 1 each 1   glucose blood test strip 1 each by Other route as needed for other (VERIGOLD). Use as instructed     indapamide (LOZOL) 2.5 MG tablet Take 2.5 mg by mouth daily.     KLOR-CON M20 20 MEQ tablet      Lancets MISC by Does not apply route.     metFORMIN (GLUCOPHAGE-XR) 500 MG 24 hr tablet Take 500 mg by mouth daily.     metoprolol succinate (TOPROL-XL) 50 MG 24 hr tablet TAKE 1 TABLET BY MOUTH EVERY DAY 30 tablet 0   montelukast (SINGULAIR) 10 MG tablet TAKE 1 TABLET BY MOUTH EVERYDAY AT BEDTIME 90 tablet 1   Multiple Vitamins-Minerals (MULTIVITAMIN PO) Take 1 tablet by mouth daily.      nitroGLYCERIN (NITROSTAT) 0.4 MG SL tablet Place 0.4 mg under the tongue every 5 (five) minutes as needed for chest pain.     pravastatin (PRAVACHOL) 40 MG tablet Take 40 mg by mouth daily.  3   sertraline (ZOLOFT) 25 MG tablet Take 25 mg by  mouth daily.     valsartan (DIOVAN) 320 MG tablet Take 1 tablet (320 mg total) by mouth daily. 90 tablet 3   amLODipine (NORVASC) 5 MG tablet Take 5 mg by mouth daily. (Patient not taking: Reported on 07/03/2023)     No current facility-administered medications for this visit.     Known medication allergies: Allergies  Allergen Reactions   Elemental Sulfur Rash   Sulfa Antibiotics Rash          Physical examination: Blood pressure (!) 140/84, pulse (!) 52, temperature 97.6 F (36.4 C), resp. rate 18, height 5\' 6"  (1.676 m), SpO2 97%.  General: Alert, interactive, in no acute distress. HEENT: PERRLA, TMs pearly gray, turbinates non-edematous without discharge, post-pharynx non erythematous. Neck: Supple without  lymphadenopathy. Lungs: Clear to auscultation without wheezing, rhonchi or rales. {no increased work of breathing. CV: Normal S1, S2 without murmurs. Abdomen: Nondistended, nontender. Skin: Warm and dry, without lesions or rashes. Extremities:  No clubbing, cyanosis or edema. Neuro:   Grossly intact.  Diagnositics/Labs: Spirometry: FEV1: 2.36L 110%, FVC: 3.13L 110%, ratio consistent with nonobstructive pattern  Assessment and plan: Mild persistent asthma - Continues to be well controlled  - Lung function testing looks great today (better than last year! - Continue Singulair 10mg  at bedtime - Continue albuterol 2 puffs every 4-6 hours as needed for cough, wheeze, shortness of breath - Asthma action plan: use Qvar 80 g 2 puffs twice a day during respiratory illness/asthma exacerbation Asthma control goals:  Full participation in all desired activities (may need albuterol before activity) Albuterol use two time or less a week on average (not counting use with activity) Cough interfering with sleep two time or less a month Oral steroids no more than once a year No hospitalizations   Allergic rhinoconjunctivitis - Continue avoidance: Weeds, Dust Mite and Mold.       - Claritin 10mg  by mouth once daily for runny nose or itching as needed. - Continue Azelestaine-Fluticasone spray 1-2 spray(s) each nostril daily as needed for stuffy nose or drainage. - Continue monthly allergen immunotherapy for now.  Repeating environmental allergy panel to see if we need to adjust your vials and/or can stop.  If testing returns completely negative then you will be able to discontinue shots.         -Epi-pen/Benadryl as needed.   Follow up Visit: 12 months or sooner if needed.  I appreciate the opportunity to take part in [name] [name]'s care. Please do not hesitate to contact me with questions.  Sincerely,   Margo Aye, MD Allergy/Immunology [facility]

## 2023-07-03 NOTE — Patient Instructions (Addendum)
Mild persistent asthma - Continues to be well controlled  - Lung function testing looks great today (better than last year! - Continue Singulair 10mg  at bedtime - Continue albuterol 2 puffs every 4-6 hours as needed for cough, wheeze, shortness of breath - Asthma action plan: use Qvar 80 g 2 puffs twice a day during respiratory illness/asthma exacerbation Asthma control goals:  Full participation in all desired activities (may need albuterol before activity) Albuterol use two time or less a week on average (not counting use with activity) Cough interfering with sleep two time or less a month Oral steroids no more than once a year No hospitalizations   Allergic rhinoconjunctivitis - Continue avoidance: Weeds, Dust Mite and Mold.       - Claritin 10mg  by mouth once daily for runny nose or itching as needed. - Continue Azelestaine-Fluticasone spray 1-2 spray(s) each nostril daily as needed for stuffy nose or drainage. - Continue monthly allergen immunotherapy for now.  Repeating environmental allergy panel to see if we need to adjust your vials and/or can stop.  If testing returns completely negative then you will be able to discontinue shots.         -Epi-pen/Benadryl as needed.   Follow up Visit: 12 months or sooner if needed.

## 2023-07-06 LAB — ALLERGENS W/TOTAL IGE AREA 2
Alternaria Alternata IgE: 0.25 kU/L — AB
Aspergillus Fumigatus IgE: 0.87 kU/L — AB
Bermuda Grass IgE: 1.06 kU/L — AB
Cat Dander IgE: <0.1 kU/L
Cedar, Mountain IgE: 0.2 kU/L — AB
Cladosporium Herbarum IgE: 0.36 kU/L — AB
Cockroach, German IgE: 0.69 kU/L — AB
Common Silver Birch IgE: <0.1 kU/L
Cottonwood IgE: <0.1 kU/L
D Farinae IgE: 4 kU/L — AB
D Pteronyssinus IgE: 6.89 kU/L — AB
Dog Dander IgE: <0.1 kU/L
Elm, American IgE: <0.1 kU/L
IgE (Immunoglobulin E), Serum: 1125 [IU]/mL — ABNORMAL HIGH (ref 6–495)
Johnson Grass IgE: 0.85 kU/L — AB
Maple/Box Elder IgE: <0.1 kU/L
Mouse Urine IgE: <0.1 kU/L
Oak, White IgE: <0.1 kU/L
Pecan, Hickory IgE: <0.1 kU/L
Penicillium Chrysogen IgE: 3.44 kU/L — AB
Pigweed, Rough IgE: <0.1 kU/L
Ragweed, Short IgE: 0.25 kU/L — AB
Sheep Sorrel IgE Qn: <0.1 kU/L
Timothy Grass IgE: 3.05 kU/L — AB
White Mulberry IgE: <0.1 kU/L

## 2023-07-08 NOTE — Progress Notes (Unsigned)
Cardiology Office Note:    Date:  07/08/2023   ID:  [name] [name], DOB 08/02/1941, MRN [id number]  PCP:  Kirby Funk, MD (Inactive)  Cardiologist:  Maisie Fus, MD   Referring MD: Kirby Funk, MD   No chief complaint on file.   History of Present Illness:    [name] [name] is a 82 y.o. male with a hx of CABG 2008 , hypertension, DM2, and hyperlipidemia.   He is under significant stress related to his wife's cognitive impairment, the time required to adequately care for her, the difficulty with having the prepared meals since his wife is unable to cook, and difficulty sleeping because of nocturia.  He denies angina.  No palpitations.  He has not had syncope.  He has not been as able to freely exercise because he has concerns about leaving his wife alone at home.  There have been no symptoms that causes him to have concern for changing cardiac status.  Interim hx 01/08/2023 Former patient of Dr. Katrinka Blazing, his notes are above. He denies CP or SOB. He is walking more. He checks his Bps at home,up to 170s , prior to his medications. Was just prescribed norvasc 5 mg daily; has not started it yet. He continues to take care of his wife with dementia.  Past Medical History:  Diagnosis Date   Allergic rhinitis    Immunotherapy, [place]   Asthma    BPH (benign prostatic hyperplasia)    CAD (coronary artery disease)    CABG in 2008 with LIMA to LAD, SVG to Diag., SVG to OM1 and 2, SVG to PDA and PLOM, angioplasty x2 in 1990s, Dr. Katrinka Blazing.   Constipation    Coronary atherosclerosis of native coronary artery    Diabetes mellitus type 2, uncontrolled 11/29/2012   ED (erectile dysfunction)    Essential hypertension, benign    Good control   Hemorrhoids    Hyperlipidemia    Good control   IBS (irritable bowel syndrome)    Hx of, with constipation    Past Surgical History:  Procedure Laterality Date   CORONARY ANGIOPLASTY WITH STENT PLACEMENT  1990s   Angioplasty x2 in 1990s, Dr. Katrinka Blazing.    CORONARY ARTERY BYPASS GRAFT  2008   With LIMA to LAD, SVG to Diag., SVG to OM1 and 2, SVG to PDA and PLOM, angioplasty x2 in 1990s, Dr. Katrinka Blazing.   TONSILLECTOMY     teenager    Current Medications: No outpatient medications have been marked as taking for the 07/09/23 encounter (Appointment) with Maisie Fus, MD.     Allergies:   Elemental sulfur and Sulfa antibiotics   Social History   Socioeconomic History   Marital status: Married    Spouse name: Not on file   Number of children: Not on file   Years of education: Not on file   Highest education level: Not on file  Occupational History   Not on file  Tobacco Use   Smoking status: Former    Current packs/day: 0.00    Types: Cigarettes    Quit date: 10/07/1967    Years since quitting: 55.7   Smokeless tobacco: Never  Vaping Use   Vaping status: Never Used  Substance and Sexual Activity   Alcohol use: Yes    Comment: wine with evening meal occasionally   Drug use: No   Sexual activity: Not Currently  Other Topics Concern   Not on file  Social History Narrative   Not on file  Social Determinants of Health   Financial Resource Strain: Not on file  Food Insecurity: Not on file  Transportation Needs: Not on file  Physical Activity: Not on file  Stress: Not on file  Social Connections: Not on file     Family History: The patient's family history includes Asthma in his mother; CAD in his father; Colon cancer in his maternal aunt; Heart attack in his father.  ROS:   Please see the history of present illness.    He is compliant with medications but is disturbed that his cholesterol and hemoglobin A1c are not as well-controlled.  He also notices some increase in blood pressures.  He does admit that he is not exercising as much and is unable to control sodium in his diet as well as he could previously.  All other systems reviewed and are negative.  EKGs/Labs/Other Studies Reviewed:    The following studies were  reviewed today:  2D Doppler echocardiogram 12/07/2020: IMPRESSIONS   1. Left ventricular ejection fraction, by estimation, is 60 to 65%. The  left ventricle has normal function. The left ventricle has no regional  wall motion abnormalities. There is mild concentric left ventricular  hypertrophy. Left ventricular diastolic  parameters were normal.   2. Right ventricular systolic function is normal. The right ventricular  size is normal.   3. Left atrial size was mildly dilated.   4. Right atrial size was mildly dilated.   5. The mitral valve is normal in structure. Mild mitral valve  regurgitation. No evidence of mitral stenosis.   6. The aortic valve is normal in structure. Aortic valve regurgitation is  mild. Mild aortic valve sclerosis is present, with no evidence of aortic  valve stenosis.   7. The inferior vena cava is normal in size with greater than 50%  respiratory variability, suggesting right atrial pressure of 3 mmHg.   EKG:  EKG normal sinus rhythm, nonspecific T wave flattening, prominent voltage.  When compared to the prior tracing performed November 23, 2020, the heart rate is slightly faster on today's tracing.  EKG 01/08/2023- sinus bradycardia, no ischemic changes  Recent Labs: No results found for requested labs within last 365 days.   Recent Lipid Panel    Component Value Date/Time   CHOL 139 10/17/2020 0850   TRIG 127 10/17/2020 0850   HDL 52 10/17/2020 0850   CHOLHDL 2.7 10/17/2020 0850   CHOLHDL 2.7 09/26/2016 1015   VLDL 23 09/26/2016 1015   LDLCALC 65 10/17/2020 0850    Physical Exam:    VS:   There were no vitals filed for this visit.    Wt Readings from Last 3 Encounters:  01/08/23 171 lb (77.6 kg)  07/04/22 173 lb 8 oz (78.7 kg)  12/27/21 168 lb 9.6 oz (76.5 kg)     GEN: Appears younger than stated age. No acute distress HEENT: Normal NECK: No JVD. LYMPHATICS: No lymphadenopathy CARDIAC: No murmur. RRR S4 but no S3 gallop, or  edema. VASCULAR:  Normal Pulses. No bruits. RESPIRATORY:  Clear to auscultation without rales, wheezing or rhonchi  ABDOMEN: Soft, non-tender, non-distended, No pulsatile mass, MUSCULOSKELETAL: No deformity  SKIN: Warm and dry NEUROLOGIC:  Alert and oriented x 3 PSYCHIATRIC:  Normal affect   ASSESSMENT:    CAD: CABG 2008 - Dr. Katrinka Blazing discussed secondary prevention: LDL target less than 70.  Systolic blood pressure less than 130.  Hemoglobin A1c less than 7 no targets.   - LDL at goal 65 mg/dL; continue pravachol 40  mg daily - BP not well controlled. Continue valsartan 320 mg daily. Starting norvasc 5 mg daily, can uptitrate to 10 mg daily if BP not at goal < 130/80 mmHg. Discussed he can call us, if his home blood pressures are not at goal a few weeks after starting his medications - continue farxiga 10 mg daily. A1c 7.7 - they discussed importance of diet/DASH diet  PLAN:    In order of problems listed above:   ***Follow up 12 months  Medication Adjustments/Labs and Tests Ordered: Current medicines are reviewed at length with the patient today.  Concerns regarding medicines are outlined above.  No orders of the defined types were placed in this encounter.  No orders of the defined types were placed in this encounter.   There are no Patient Instructions on file for this visit.   Signed, Maisie Fus, MD  07/08/2023 6:41 PM    [facility]

## 2023-07-09 ENCOUNTER — Encounter: Payer: Self-pay | Admitting: Internal Medicine

## 2023-07-09 ENCOUNTER — Ambulatory Visit: Payer: Medicare Other | Attending: Internal Medicine | Admitting: Internal Medicine

## 2023-07-09 VITALS — BP 128/66 | HR 56 | Ht 67.0 in | Wt 167.0 lb

## 2023-07-09 DIAGNOSIS — Z951 Presence of aortocoronary bypass graft: Secondary | ICD-10-CM | POA: Insufficient documentation

## 2023-07-09 NOTE — Patient Instructions (Signed)
Medication Instructions:  Your physician recommends that you continue on your current medications as directed. Please refer to the Current Medication list given to you today.  *If you need a refill on your cardiac medications before your next appointment, please call your pharmacy*   Lab Work: NONE If you have labs (blood work) drawn today and your tests are completely normal, you will receive your results only by: MyChart Message (if you have MyChart) OR A paper copy in the mail If you have any lab test that is abnormal or we need to change your treatment, we will call you to review the results.   Testing/Procedures: NONE   Follow-Up: At [facility], you and your health needs are our priority.  As part of our continuing mission to provide you with exceptional heart care, we have created designated Provider Care Teams.  These Care Teams include your primary Cardiologist (physician) and Advanced Practice Providers (APPs -  Physician Assistants and Nurse Practitioners) who all work together to provide you with the care you need, when you need it.  We recommend signing up for the patient portal called "MyChart".  Sign up information is provided on this After Visit Summary.  MyChart is used to connect with patients for Virtual Visits (Telemedicine).  Patients are able to view lab/test results, encounter notes, upcoming appointments, etc.  Non-urgent messages can be sent to your provider as well.   To learn more about what you can do with MyChart, go to [email or web address].    Your next appointment:   1 year(s)  Provider:   Carolan Clines, MD

## 2023-07-10 DIAGNOSIS — J3089 Other allergic rhinitis: Secondary | ICD-10-CM | POA: Diagnosis not present

## 2023-07-10 NOTE — Progress Notes (Signed)
VIAL EXP 07-09-24

## 2023-07-23 DIAGNOSIS — Z23 Encounter for immunization: Secondary | ICD-10-CM | POA: Diagnosis not present

## 2023-07-30 DIAGNOSIS — I10 Essential (primary) hypertension: Secondary | ICD-10-CM | POA: Diagnosis not present

## 2023-07-30 DIAGNOSIS — R634 Abnormal weight loss: Secondary | ICD-10-CM | POA: Diagnosis not present

## 2023-07-30 DIAGNOSIS — Z636 Dependent relative needing care at home: Secondary | ICD-10-CM | POA: Diagnosis not present

## 2023-07-30 DIAGNOSIS — E1122 Type 2 diabetes mellitus with diabetic chronic kidney disease: Secondary | ICD-10-CM | POA: Diagnosis not present

## 2023-08-06 ENCOUNTER — Ambulatory Visit (INDEPENDENT_AMBULATORY_CARE_PROVIDER_SITE_OTHER): Payer: Medicare Other

## 2023-08-06 DIAGNOSIS — J309 Allergic rhinitis, unspecified: Secondary | ICD-10-CM

## 2023-08-15 ENCOUNTER — Other Ambulatory Visit: Payer: Self-pay | Admitting: Internal Medicine

## 2023-09-05 ENCOUNTER — Ambulatory Visit (INDEPENDENT_AMBULATORY_CARE_PROVIDER_SITE_OTHER): Payer: Medicare Other

## 2023-09-05 DIAGNOSIS — J309 Allergic rhinitis, unspecified: Secondary | ICD-10-CM

## 2023-09-05 MED ORDER — EPINEPHRINE 0.3 MG/0.3ML IJ SOAJ: 0.3000 mg | INTRAMUSCULAR | 1 refills | Status: AC | PRN

## 2023-10-01 ENCOUNTER — Ambulatory Visit (INDEPENDENT_AMBULATORY_CARE_PROVIDER_SITE_OTHER): Payer: Medicare Other

## 2023-10-01 DIAGNOSIS — J309 Allergic rhinitis, unspecified: Secondary | ICD-10-CM

## 2023-10-02 DIAGNOSIS — J3089 Other allergic rhinitis: Secondary | ICD-10-CM | POA: Diagnosis not present

## 2023-10-02 NOTE — Progress Notes (Signed)
VIAL EXP 11-22-23 

## 2023-11-05 ENCOUNTER — Ambulatory Visit: Payer: Medicare Other

## 2023-11-05 DIAGNOSIS — J309 Allergic rhinitis, unspecified: Secondary | ICD-10-CM

## 2023-11-14 ENCOUNTER — Ambulatory Visit: Payer: Medicare Other

## 2023-11-14 DIAGNOSIS — J309 Allergic rhinitis, unspecified: Secondary | ICD-10-CM

## 2023-11-15 DIAGNOSIS — N401 Enlarged prostate with lower urinary tract symptoms: Secondary | ICD-10-CM | POA: Diagnosis not present

## 2023-11-15 DIAGNOSIS — R351 Nocturia: Secondary | ICD-10-CM | POA: Diagnosis not present

## 2023-11-19 ENCOUNTER — Ambulatory Visit (INDEPENDENT_AMBULATORY_CARE_PROVIDER_SITE_OTHER): Payer: Medicare Other

## 2023-11-19 DIAGNOSIS — J309 Allergic rhinitis, unspecified: Secondary | ICD-10-CM

## 2023-11-27 DIAGNOSIS — Z636 Dependent relative needing care at home: Secondary | ICD-10-CM | POA: Diagnosis not present

## 2023-11-27 DIAGNOSIS — J4520 Mild intermittent asthma, uncomplicated: Secondary | ICD-10-CM | POA: Diagnosis not present

## 2023-11-27 DIAGNOSIS — E7800 Pure hypercholesterolemia, unspecified: Secondary | ICD-10-CM | POA: Diagnosis not present

## 2023-11-27 DIAGNOSIS — N400 Enlarged prostate without lower urinary tract symptoms: Secondary | ICD-10-CM | POA: Diagnosis not present

## 2023-11-27 DIAGNOSIS — Z79899 Other long term (current) drug therapy: Secondary | ICD-10-CM | POA: Diagnosis not present

## 2023-11-27 DIAGNOSIS — I2510 Atherosclerotic heart disease of native coronary artery without angina pectoris: Secondary | ICD-10-CM | POA: Diagnosis not present

## 2023-11-27 DIAGNOSIS — I10 Essential (primary) hypertension: Secondary | ICD-10-CM | POA: Diagnosis not present

## 2023-11-27 DIAGNOSIS — E1122 Type 2 diabetes mellitus with diabetic chronic kidney disease: Secondary | ICD-10-CM | POA: Diagnosis not present

## 2023-11-27 DIAGNOSIS — Z0000 Encounter for general adult medical examination without abnormal findings: Secondary | ICD-10-CM | POA: Diagnosis not present

## 2023-12-19 ENCOUNTER — Ambulatory Visit (INDEPENDENT_AMBULATORY_CARE_PROVIDER_SITE_OTHER): Payer: Medicare Other

## 2023-12-19 DIAGNOSIS — J309 Allergic rhinitis, unspecified: Secondary | ICD-10-CM | POA: Diagnosis not present

## 2024-01-27 DIAGNOSIS — E7800 Pure hypercholesterolemia, unspecified: Secondary | ICD-10-CM | POA: Diagnosis not present

## 2024-01-27 DIAGNOSIS — N400 Enlarged prostate without lower urinary tract symptoms: Secondary | ICD-10-CM | POA: Diagnosis not present

## 2024-01-27 DIAGNOSIS — I2510 Atherosclerotic heart disease of native coronary artery without angina pectoris: Secondary | ICD-10-CM | POA: Diagnosis not present

## 2024-01-27 DIAGNOSIS — J4520 Mild intermittent asthma, uncomplicated: Secondary | ICD-10-CM | POA: Diagnosis not present

## 2024-01-28 ENCOUNTER — Ambulatory Visit (INDEPENDENT_AMBULATORY_CARE_PROVIDER_SITE_OTHER)

## 2024-01-28 DIAGNOSIS — J309 Allergic rhinitis, unspecified: Secondary | ICD-10-CM | POA: Diagnosis not present

## 2024-02-26 DIAGNOSIS — E7800 Pure hypercholesterolemia, unspecified: Secondary | ICD-10-CM | POA: Diagnosis not present

## 2024-02-26 DIAGNOSIS — J4520 Mild intermittent asthma, uncomplicated: Secondary | ICD-10-CM | POA: Diagnosis not present

## 2024-02-26 DIAGNOSIS — N400 Enlarged prostate without lower urinary tract symptoms: Secondary | ICD-10-CM | POA: Diagnosis not present

## 2024-02-26 DIAGNOSIS — I2510 Atherosclerotic heart disease of native coronary artery without angina pectoris: Secondary | ICD-10-CM | POA: Diagnosis not present

## 2024-03-03 ENCOUNTER — Ambulatory Visit (INDEPENDENT_AMBULATORY_CARE_PROVIDER_SITE_OTHER)

## 2024-03-03 DIAGNOSIS — J309 Allergic rhinitis, unspecified: Secondary | ICD-10-CM

## 2024-03-23 ENCOUNTER — Other Ambulatory Visit: Payer: Self-pay | Admitting: Allergy

## 2024-03-31 ENCOUNTER — Ambulatory Visit (INDEPENDENT_AMBULATORY_CARE_PROVIDER_SITE_OTHER)

## 2024-03-31 DIAGNOSIS — J309 Allergic rhinitis, unspecified: Secondary | ICD-10-CM

## 2024-04-27 DIAGNOSIS — J4520 Mild intermittent asthma, uncomplicated: Secondary | ICD-10-CM | POA: Diagnosis not present

## 2024-04-27 DIAGNOSIS — E7800 Pure hypercholesterolemia, unspecified: Secondary | ICD-10-CM | POA: Diagnosis not present

## 2024-04-27 DIAGNOSIS — I2510 Atherosclerotic heart disease of native coronary artery without angina pectoris: Secondary | ICD-10-CM | POA: Diagnosis not present

## 2024-04-27 DIAGNOSIS — N400 Enlarged prostate without lower urinary tract symptoms: Secondary | ICD-10-CM | POA: Diagnosis not present

## 2024-04-28 ENCOUNTER — Ambulatory Visit (INDEPENDENT_AMBULATORY_CARE_PROVIDER_SITE_OTHER)

## 2024-04-28 DIAGNOSIS — J309 Allergic rhinitis, unspecified: Secondary | ICD-10-CM

## 2024-05-24 ENCOUNTER — Other Ambulatory Visit: Payer: Self-pay | Admitting: Allergy

## 2024-05-28 ENCOUNTER — Ambulatory Visit (INDEPENDENT_AMBULATORY_CARE_PROVIDER_SITE_OTHER)

## 2024-05-28 DIAGNOSIS — N400 Enlarged prostate without lower urinary tract symptoms: Secondary | ICD-10-CM | POA: Diagnosis not present

## 2024-05-28 DIAGNOSIS — J309 Allergic rhinitis, unspecified: Secondary | ICD-10-CM | POA: Diagnosis not present

## 2024-05-28 DIAGNOSIS — E7800 Pure hypercholesterolemia, unspecified: Secondary | ICD-10-CM | POA: Diagnosis not present

## 2024-05-28 DIAGNOSIS — I2510 Atherosclerotic heart disease of native coronary artery without angina pectoris: Secondary | ICD-10-CM | POA: Diagnosis not present

## 2024-05-28 DIAGNOSIS — J4520 Mild intermittent asthma, uncomplicated: Secondary | ICD-10-CM | POA: Diagnosis not present

## 2024-06-23 ENCOUNTER — Ambulatory Visit (INDEPENDENT_AMBULATORY_CARE_PROVIDER_SITE_OTHER)

## 2024-06-23 DIAGNOSIS — J309 Allergic rhinitis, unspecified: Secondary | ICD-10-CM | POA: Diagnosis not present

## 2024-06-28 DIAGNOSIS — J4520 Mild intermittent asthma, uncomplicated: Secondary | ICD-10-CM | POA: Diagnosis not present

## 2024-06-28 DIAGNOSIS — E7800 Pure hypercholesterolemia, unspecified: Secondary | ICD-10-CM | POA: Diagnosis not present

## 2024-06-28 DIAGNOSIS — N400 Enlarged prostate without lower urinary tract symptoms: Secondary | ICD-10-CM | POA: Diagnosis not present

## 2024-06-28 DIAGNOSIS — I2510 Atherosclerotic heart disease of native coronary artery without angina pectoris: Secondary | ICD-10-CM | POA: Diagnosis not present

## 2024-07-02 ENCOUNTER — Other Ambulatory Visit: Payer: Self-pay

## 2024-07-02 ENCOUNTER — Encounter: Payer: Self-pay | Admitting: Allergy

## 2024-07-02 ENCOUNTER — Ambulatory Visit (INDEPENDENT_AMBULATORY_CARE_PROVIDER_SITE_OTHER): Payer: Medicare Other | Admitting: Allergy

## 2024-07-02 DIAGNOSIS — H1010 Acute atopic conjunctivitis, unspecified eye: Secondary | ICD-10-CM

## 2024-07-02 DIAGNOSIS — J309 Allergic rhinitis, unspecified: Secondary | ICD-10-CM | POA: Diagnosis not present

## 2024-07-02 DIAGNOSIS — J4520 Mild intermittent asthma, uncomplicated: Secondary | ICD-10-CM | POA: Diagnosis not present

## 2024-07-02 DIAGNOSIS — H1013 Acute atopic conjunctivitis, bilateral: Secondary | ICD-10-CM | POA: Diagnosis not present

## 2024-07-02 MED ORDER — MONTELUKAST SODIUM 10 MG PO TABS: ORAL_TABLET | ORAL | 4 refills | Status: AC

## 2024-07-02 MED ORDER — AZELASTINE-FLUTICASONE 137-50 MCG/ACT NA SUSP: NASAL | 12 refills | Status: AC

## 2024-07-02 MED ORDER — QVAR REDIHALER 80 MCG/ACT IN AERB: 2.0000 | INHALATION_SPRAY | RESPIRATORY_TRACT | 5 refills | Status: AC | PRN

## 2024-07-02 MED ORDER — ALBUTEROL SULFATE HFA 108 (90 BASE) MCG/ACT IN AERS: INHALATION_SPRAY | RESPIRATORY_TRACT | 5 refills | Status: AC

## 2024-07-02 NOTE — Progress Notes (Signed)
 Follow-up Note  RE: [name] [name] MRN: [id number] DOB: 1941-04-18 Date of Office Visit: 07/02/2024   History of present illness: [name] [name] is a 83 y.o. male presenting today for follow-up of allergic rhinitis with conjunctivitis and asthma.  He was last seen in the office on 07/03/2023 by myself.  He presents today with his wife.  He has been doing well since his last visit without any major health changes, surgeries or hospitalizations.  He states that this pollen season has not been bad.  He continues on his allergy shots and tolerating this well without large local or systemic reactions.  He states the combination of his allergy regimen is also effective including Singulair , Claritin and Dymista  nasal spray.  In regards to his asthma he states that is doing well as well.  He has not had to use albuterol  over the past year.  He has not really had any illnesses sting need to use Qvar .  He really has done well this year.   Review of systems: 10pt ROS negative unless noted above in HPI  Past medical/social/surgical/family history have been reviewed and are unchanged unless specifically indicated below.  No changes  Medication List: Current Outpatient Medications  Medication Sig Dispense Refill   albuterol  (VENTOLIN  HFA) 108 (90 Base) MCG/ACT inhaler TAKE 2 PUFFS BY MOUTH EVERY 4 HOURS AS NEEDED 18 each 1   amLODipine (NORVASC) 5 MG tablet Take 5 mg by mouth daily.     aspirin 81 MG tablet Take 81 mg by mouth daily.      Azelastine -Fluticasone  137-50 MCG/ACT SUSP INSTILL 1 SPRAY INTO THE NOSTRIL 2 TIMES DAILY 23 g 5   beclomethasone (QVAR  REDIHALER) 80 MCG/ACT inhaler Inhale 2 puffs into the lungs 2 (two) times daily. (Patient taking differently: Inhale 2 puffs into the lungs as needed.) 1 each 2   clobetasol (TEMOVATE) 0.05 % external solution Apply 1 application topically daily as needed (DANRUFF).      dapagliflozin  propanediol (FARXIGA ) 10 MG TABS tablet TAKE 1 TABLET BY MOUTH DAILY  BEFORE BREAKFAST. 60 tablet 5   EPINEPHrine  0.3 mg/0.3 mL IJ SOAJ injection Inject 0.3 mg into the muscle as needed for anaphylaxis. 1 each 1   glucose blood test strip 1 each by Other route as needed for other (VERIGOLD). Use as instructed     ibuprofen (ADVIL) 600 MG tablet Take 600 mg by mouth every 6 (six) hours as needed.     indapamide  (LOZOL ) 2.5 MG tablet Take 2.5 mg by mouth daily.     Lancets MISC by Does not apply route.     metFORMIN (GLUCOPHAGE-XR) 500 MG 24 hr tablet Take 500 mg by mouth daily.     metoprolol  succinate (TOPROL -XL) 50 MG 24 hr tablet TAKE 1 TABLET BY MOUTH EVERY DAY 30 tablet 0   montelukast  (SINGULAIR ) 10 MG tablet TAKE 1 TABLET BY MOUTH EVERYDAY AT BEDTIME 90 tablet 0   Multiple Vitamins-Minerals (MULTIVITAMIN PO) Take 1 tablet by mouth daily.      nitroGLYCERIN (NITROSTAT) 0.4 MG SL tablet Place 0.4 mg under the tongue every 5 (five) minutes as needed for chest pain.     pravastatin  (PRAVACHOL ) 40 MG tablet Take 40 mg by mouth daily.  3   valsartan  (DIOVAN ) 320 MG tablet Take 1 tablet (320 mg total) by mouth daily. 90 tablet 3   KLOR-CON M20 20 MEQ tablet  (Patient not taking: Reported on 07/02/2024)     sertraline (ZOLOFT) 25 MG tablet Take  25 mg by mouth daily. (Patient not taking: Reported on 07/02/2024)     No current facility-administered medications for this visit.     Known medication allergies: Allergies  Allergen Reactions   Elemental Sulfur Rash   Sulfa Antibiotics Rash          Physical examination: There were no vitals taken for this visit.  General: Alert, interactive, in no acute distress. HEENT: PERRLA, TMs pearly gray, turbinates non-edematous without discharge, post-pharynx non erythematous. Neck: Supple without lymphadenopathy. Lungs: Clear to auscultation without wheezing, rhonchi or rales. {no increased work of breathing. CV: Normal S1, S2 without murmurs. Abdomen: Nondistended, nontender. Skin: Warm and dry, without lesions or  rashes. Extremities:  No clubbing, cyanosis or edema. Neuro:   Grossly intact.  Diagnostics/Labs: Spirometry: FEV1 2.32 L or 109%, FVC 3.26 L 115%.  Nonobstructive pattern  Assessment and plan: Mild persistent asthma - Continues to be well controlled  - Lung function testing looks great today! - Continue Singulair  10mg  at bedtime - Continue albuterol  2 puffs every 4-6 hours as needed for cough, wheeze, shortness of breath - Asthma action plan: use Qvar  80 g 2 puffs twice a day during respiratory illness/asthma exacerbation Asthma control goals:  Full participation in all desired activities (may need albuterol  before activity) Albuterol  use two time or less a week on average (not counting use with activity) Cough interfering with sleep two time or less a month Oral steroids no more than once a year No hospitalizations   Allergic rhinoconjunctivitis - Continue avoidance: Weeds, Dust Mite and Mold.       - Claritin 10mg  by mouth once daily for runny nose or itching as needed. - Continue Azelestaine-Fluticasone  spray 1-2 spray(s) each nostril daily as needed for stuffy nose or drainage. - Continue monthly allergen immunotherapy per protocol at this time.  I am hopeful that this time next year he may be able to discontinue his immunotherapy.  Doing well with allergy shots at this time.        -Epi-pen/Benadryl as needed.   Follow up Visit: 12 months or sooner if needed.  I appreciate the opportunity to take part in [name] [name]'s care. Please do not hesitate to contact me with questions.  Sincerely,   Danita Brain, MD Allergy/Immunology [facility]

## 2024-07-02 NOTE — Patient Instructions (Addendum)
 Mild persistent asthma - Continues to be well controlled  - Lung function testing looks great today! - Continue Singulair  10mg  at bedtime - Continue albuterol  2 puffs every 4-6 hours as needed for cough, wheeze, shortness of breath - Asthma action plan: use Qvar  80 g 2 puffs twice a day during respiratory illness/asthma exacerbation Asthma control goals:  Full participation in all desired activities (may need albuterol  before activity) Albuterol  use two time or less a week on average (not counting use with activity) Cough interfering with sleep two time or less a month Oral steroids no more than once a year No hospitalizations   Allergic rhinoconjunctivitis - Continue avoidance: Weeds, Dust Mite and Mold.       - Claritin 10mg  by mouth once daily for runny nose or itching as needed. - Continue Azelestaine-Fluticasone  spray 1-2 spray(s) each nostril daily as needed for stuffy nose or drainage. - Continue monthly allergen immunotherapy per protocol at this time.  Doing well with allergy shots at this time.        -Epi-pen/Benadryl as needed.   Follow up Visit: 12 months or sooner if needed.

## 2024-07-23 ENCOUNTER — Ambulatory Visit (INDEPENDENT_AMBULATORY_CARE_PROVIDER_SITE_OTHER)

## 2024-07-23 DIAGNOSIS — J309 Allergic rhinitis, unspecified: Secondary | ICD-10-CM

## 2024-07-28 DIAGNOSIS — J4520 Mild intermittent asthma, uncomplicated: Secondary | ICD-10-CM | POA: Diagnosis not present

## 2024-07-28 DIAGNOSIS — N400 Enlarged prostate without lower urinary tract symptoms: Secondary | ICD-10-CM | POA: Diagnosis not present

## 2024-07-28 DIAGNOSIS — I2510 Atherosclerotic heart disease of native coronary artery without angina pectoris: Secondary | ICD-10-CM | POA: Diagnosis not present

## 2024-07-28 DIAGNOSIS — E7800 Pure hypercholesterolemia, unspecified: Secondary | ICD-10-CM | POA: Diagnosis not present

## 2024-08-06 ENCOUNTER — Ambulatory Visit (INDEPENDENT_AMBULATORY_CARE_PROVIDER_SITE_OTHER)

## 2024-08-06 DIAGNOSIS — J309 Allergic rhinitis, unspecified: Secondary | ICD-10-CM | POA: Diagnosis not present

## 2024-08-12 ENCOUNTER — Encounter: Payer: Self-pay | Admitting: Allergy

## 2024-08-13 ENCOUNTER — Ambulatory Visit

## 2024-08-13 DIAGNOSIS — J309 Allergic rhinitis, unspecified: Secondary | ICD-10-CM

## 2024-08-14 DIAGNOSIS — L218 Other seborrheic dermatitis: Secondary | ICD-10-CM | POA: Diagnosis not present

## 2024-08-14 MED ORDER — AMOXICILLIN-POT CLAVULANATE 875-125 MG PO TABS: 1.0000 | ORAL_TABLET | Freq: Two times a day (BID) | ORAL | 0 refills | Status: DC

## 2024-08-14 NOTE — Addendum Note (Signed)
 Addended by: MARCINE ISAIAH CROME on: 08/14/2024 09:33 AM   Modules accepted: Orders

## 2024-08-18 MED ORDER — BENZONATATE 100 MG PO CAPS: 100.0000 mg | ORAL_CAPSULE | Freq: Three times a day (TID) | ORAL | 0 refills | Status: AC | PRN

## 2024-08-18 NOTE — Addendum Note (Signed)
 Addended by: Corsica Franson E on: 08/18/2024 05:35 PM   Modules accepted: Orders

## 2024-08-20 ENCOUNTER — Ambulatory Visit (INDEPENDENT_AMBULATORY_CARE_PROVIDER_SITE_OTHER)

## 2024-08-20 DIAGNOSIS — J309 Allergic rhinitis, unspecified: Secondary | ICD-10-CM | POA: Diagnosis not present

## 2024-08-25 ENCOUNTER — Encounter: Payer: Self-pay | Admitting: Allergy

## 2024-08-27 ENCOUNTER — Other Ambulatory Visit: Payer: Self-pay

## 2024-08-27 DIAGNOSIS — R062 Wheezing: Secondary | ICD-10-CM

## 2024-08-27 MED ORDER — BENZONATATE 100 MG PO CAPS: 100.0000 mg | ORAL_CAPSULE | Freq: Three times a day (TID) | ORAL | 0 refills | Status: DC | PRN

## 2024-08-27 NOTE — Telephone Encounter (Signed)
 PT advised that there are no refills available on the Benzonatate capsules - needs updated Rx for that, has the QVAR  available.  I advised would send to clinical staff, confirmed [facility] in [place], he thanked

## 2024-08-28 DIAGNOSIS — J4520 Mild intermittent asthma, uncomplicated: Secondary | ICD-10-CM | POA: Diagnosis not present

## 2024-08-28 DIAGNOSIS — I2510 Atherosclerotic heart disease of native coronary artery without angina pectoris: Secondary | ICD-10-CM | POA: Diagnosis not present

## 2024-08-28 DIAGNOSIS — E7800 Pure hypercholesterolemia, unspecified: Secondary | ICD-10-CM | POA: Diagnosis not present

## 2024-08-28 DIAGNOSIS — N400 Enlarged prostate without lower urinary tract symptoms: Secondary | ICD-10-CM | POA: Diagnosis not present

## 2024-09-16 NOTE — Progress Notes (Signed)
 VIAL MADE ON 09/16/24

## 2024-09-17 DIAGNOSIS — J3089 Other allergic rhinitis: Secondary | ICD-10-CM | POA: Diagnosis not present

## 2024-09-17 DIAGNOSIS — J302 Other seasonal allergic rhinitis: Secondary | ICD-10-CM | POA: Diagnosis not present

## 2024-09-17 DIAGNOSIS — J301 Allergic rhinitis due to pollen: Secondary | ICD-10-CM | POA: Diagnosis not present

## 2024-09-22 ENCOUNTER — Ambulatory Visit

## 2024-09-22 DIAGNOSIS — J309 Allergic rhinitis, unspecified: Secondary | ICD-10-CM

## 2024-09-27 DIAGNOSIS — E7800 Pure hypercholesterolemia, unspecified: Secondary | ICD-10-CM | POA: Diagnosis not present

## 2024-09-27 DIAGNOSIS — J4520 Mild intermittent asthma, uncomplicated: Secondary | ICD-10-CM | POA: Diagnosis not present

## 2024-09-27 DIAGNOSIS — N400 Enlarged prostate without lower urinary tract symptoms: Secondary | ICD-10-CM | POA: Diagnosis not present

## 2024-09-27 DIAGNOSIS — I2510 Atherosclerotic heart disease of native coronary artery without angina pectoris: Secondary | ICD-10-CM | POA: Diagnosis not present

## 2024-10-05 ENCOUNTER — Other Ambulatory Visit: Payer: Self-pay

## 2024-10-06 ENCOUNTER — Encounter (HOSPITAL_BASED_OUTPATIENT_CLINIC_OR_DEPARTMENT_OTHER): Payer: Self-pay | Admitting: Cardiovascular Disease

## 2024-10-06 MED ORDER — DAPAGLIFLOZIN PROPANEDIOL 10 MG PO TABS: 10.0000 mg | ORAL_TABLET | Freq: Every day | ORAL | 0 refills | Status: DC

## 2024-10-20 ENCOUNTER — Encounter (HOSPITAL_BASED_OUTPATIENT_CLINIC_OR_DEPARTMENT_OTHER): Payer: Self-pay

## 2024-10-21 ENCOUNTER — Encounter (HOSPITAL_BASED_OUTPATIENT_CLINIC_OR_DEPARTMENT_OTHER): Payer: Self-pay | Admitting: Cardiovascular Disease

## 2024-10-21 ENCOUNTER — Ambulatory Visit (INDEPENDENT_AMBULATORY_CARE_PROVIDER_SITE_OTHER): Admitting: Cardiovascular Disease

## 2024-10-21 VITALS — BP 108/60 | HR 57 | Ht 67.0 in | Wt 164.6 lb

## 2024-10-21 DIAGNOSIS — I10 Essential (primary) hypertension: Secondary | ICD-10-CM | POA: Diagnosis not present

## 2024-10-21 DIAGNOSIS — E118 Type 2 diabetes mellitus with unspecified complications: Secondary | ICD-10-CM | POA: Diagnosis not present

## 2024-10-21 DIAGNOSIS — I25810 Atherosclerosis of coronary artery bypass graft(s) without angina pectoris: Secondary | ICD-10-CM | POA: Diagnosis not present

## 2024-10-21 DIAGNOSIS — E7849 Other hyperlipidemia: Secondary | ICD-10-CM | POA: Diagnosis not present

## 2024-10-21 NOTE — Progress Notes (Signed)
 " Cardiology Office Note:  .   Date:  10/21/2024  ID:  [name] [name], DOB 1941/02/18, MRN [id number] PCP: [name] Rush, MD (Inactive)  [facility] Providers Cardiologist:  [name] Ronal BRAVO, MD (Inactive)    History of Present Illness: .   [name] [name] is a 83 y.o. male with CAD status post CABG, hypertension, diabetes, and hyperlipidemia here for follow-up.  He was previously a patient of Dr. [name] and then Dr. [name].  He underwent coronary artery bypass grafting in 2008.  His blood pressure was uncontrolled in 2024 and n 2024 and amlodipine was added to his losartan.  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  He last saw Dr. [name] 06/2023 and was under a lot of stress but was otherwise physically well.  Discussed the use of AI scribe software for clinical note transcription with the patient, who gave verbal consent to proceed.  History of Present Illness [name] [name] maintains physical activity by walking at least twice a week, covering about a mile and a half each time. No chest pain or pressure is experienced during these walks, although uphill walking is more challenging. He can increase his heart rate to about 121 bpm before needing to slow down. No issues with breathing or swelling in his legs or feet are noted.  He has a history of coronary artery disease, having undergone six bypass surgeries in 2008 after symptoms were not initially detected by EKG or stress tests. Further testing with dye revealed the need for surgery. He was previously told he had a heart murmur, which was evaluated with an echocardiogram in 2022 and found to be benign.  Blood pressure readings at home are typically in the 130s/80s, but he has not checked it regularly since July.  He is a caregiver for his wife, who was diagnosed with dementia in 2019. This role has been stressful, leading to the use of Zoloft for stress management. He has assistance from a caregiver three days a  week, and his wife attends a program at [employer] twice a week, providing him some respite.  His diet includes frequent consumption of sausage and eggs, and he enjoys eating at [employer], although he tries to choose healthier options like 'skinny biscuits'. At home, he typically eats cereal such as Raisin Bran and Cheerios.  ROS:  As per HPI  Studies Reviewed: SABRA   EKG Interpretation Date/Time:  Wednesday October 21 2024 09:41:01 EST Ventricular Rate:  57 PR Interval:  236 QRS Duration:  98 QT Interval:  400 QTC Calculation: 389 R Axis:   31  Text Interpretation: Sinus bradycardia with 1st degree A-V block Nonspecific T wave abnormality When compared with ECG of 31-Jul-2016 08:58, Fusion complexes are no longer Present Premature ventricular complexes are no longer Present Premature supraventricular complexes are no longer Present PR interval has increased Nonspecific T wave abnormality now evident in Anterior leads Confirmed by [name] ([id number]) on 10/21/2024 9:48:19 AM   Echo 11/2020: 1. Left ventricular ejection fraction, by estimation, is 60 to 65%. The  left ventricle has normal function. The left ventricle has no regional  wall motion abnormalities. There is mild concentric left ventricular  hypertrophy. Left ventricular diastolic  parameters were normal.   2. Right ventricular systolic function is normal. The right ventricular  size is normal.   3. Left atrial size was mildly dilated.   4. Right atrial size was mildly dilated.   5. The mitral  valve is normal in structure. Mild mitral valve  regurgitation. No evidence of mitral stenosis.   6. The aortic valve is normal in structure. Aortic valve regurgitation is  mild. Mild aortic valve sclerosis is present, with no evidence of aortic  valve stenosis.   7. The inferior vena cava is normal in size with greater than 50%  respiratory variability, suggesting right atrial pressure of 3 mmHg.   Risk  Assessment/Calculations:         Physical Exam:   VS:  BP 108/60 (BP Location: Left Arm, Patient Position: Sitting)   Pulse (!) 57   Ht 5' 7 (1.702 m)   Wt 164 lb 9.6 oz (74.7 kg)   SpO2 99%   BMI 25.78 kg/m  , BMI Body mass index is 25.78 kg/m. GENERAL:  Well appearing HEENT: Pupils equal round and reactive, fundi not visualized, oral mucosa unremarkable NECK:  No jugular venous distention, waveform within normal limits, carotid upstroke brisk and symmetric, no bruits, no thyromegaly LUNGS:  Clear to auscultation bilaterally HEART:  RRR.  PMI not displaced or sustained,S1 and S2 within normal limits, no S3, no S4, no clicks, no rubs, no murmurs ABD:  Flat, positive bowel sounds normal in frequency in pitch, no bruits, no rebound, no guarding, no midline pulsatile mass, no hepatomegaly, no splenomegaly EXT:  2 plus pulses throughout, no edema, no cyanosis no clubbing SKIN:  No rashes no nodules NEURO:  Cranial nerves II through XII grossly intact, motor grossly intact throughout PSYCH:  Cognitively intact, oriented to person place and time   ASSESSMENT AND PLAN: .    Assessment & Plan # Coronary artery disease status post coronary artery bypass grafting Coronary artery disease with six bypasses in 2008. No current symptoms or changes in exercise tolerance. EKG normal, stress tests inconclusive. Heart murmur faint and benign. - Continue current medications.  Aspirin, amlodipine, metoprolol  and pravastatin  - Monitor for new symptoms such as chest pain or pressure during physical activity.  # Hyperlipidemia:  Continue pravastatin .  LDL is at goal.   # Essential hypertension Blood pressure at home typically 130s/80s, lower in office. No symptoms of lightheadedness or dizziness, but recent balance issues noted.  He was not orthostatic on exam today and BP was higher on repeat.  - Continue current regimen of amlodipine, indapamide  and metoprolol . - Resume home BP monitoring.     Dispo: f/u 1 year  Signed, [name], MD   "

## 2024-10-21 NOTE — Patient Instructions (Addendum)
 Medication Instructions:  Your physician recommends that you continue on your current medications as directed. Please refer to the Current Medication list given to you today.   *If you need a refill on your cardiac medications before your next appointment, please call your pharmacy*  Lab Work: NONE  Testing/Procedures: NONE  Follow-Up: At [facility], you and your health needs are our priority.  As part of our continuing mission to provide you with exceptional heart care, our providers are all part of one team.  This team includes your primary Cardiologist (physician) and Advanced Practice Providers or APPs (Physician Assistants and Nurse Practitioners) who all work together to provide you with the care you need, when you need it.  Your next appointment:   12 month(s)  Provider:   Chilton Si, MD, Eligha Bridegroom, NP, or Gillian Shields, NP    We recommend signing up for the patient portal called "MyChart".  Sign up information is provided on this After Visit Summary.  MyChart is used to connect with patients for Virtual Visits (Telemedicine).  Patients are able to view lab/test results, encounter notes, upcoming appointments, etc.  Non-urgent messages can be sent to your provider as well.   To learn more about what you can do with MyChart, go to [email or web address].

## 2024-11-17 ENCOUNTER — Ambulatory Visit (INDEPENDENT_AMBULATORY_CARE_PROVIDER_SITE_OTHER)

## 2024-11-17 DIAGNOSIS — J302 Other seasonal allergic rhinitis: Secondary | ICD-10-CM

## 2024-11-20 ENCOUNTER — Encounter (HOSPITAL_BASED_OUTPATIENT_CLINIC_OR_DEPARTMENT_OTHER): Payer: Self-pay | Admitting: Cardiovascular Disease

## 2024-11-23 ENCOUNTER — Other Ambulatory Visit: Payer: Self-pay

## 2024-11-23 MED ORDER — DAPAGLIFLOZIN PROPANEDIOL 10 MG PO TABS
10.0000 mg | ORAL_TABLET | Freq: Every day | ORAL | 3 refills | Status: DC
  Filled 2024-11-23: qty 90, 90d supply, fill #0

## 2024-11-26 ENCOUNTER — Other Ambulatory Visit (HOSPITAL_BASED_OUTPATIENT_CLINIC_OR_DEPARTMENT_OTHER): Payer: Self-pay | Admitting: Cardiovascular Disease

## 2024-11-27 ENCOUNTER — Other Ambulatory Visit (HOSPITAL_BASED_OUTPATIENT_CLINIC_OR_DEPARTMENT_OTHER): Payer: Self-pay

## 2024-11-27 ENCOUNTER — Encounter (HOSPITAL_BASED_OUTPATIENT_CLINIC_OR_DEPARTMENT_OTHER): Payer: Self-pay | Admitting: Cardiovascular Disease
